# Patient Record
Sex: Female | Born: 1955
Health system: Southern US, Community
[De-identification: ages and names within clinical notes are randomized; demographics above are authoritative.]

## PROBLEM LIST (undated history)

## (undated) DIAGNOSIS — R822 Biliuria: Secondary | ICD-10-CM

## (undated) DIAGNOSIS — Z8489 Family history of other specified conditions: Secondary | ICD-10-CM

## (undated) DIAGNOSIS — Z809 Family history of malignant neoplasm, unspecified: Secondary | ICD-10-CM

## (undated) DIAGNOSIS — K8689 Other specified diseases of pancreas: Secondary | ICD-10-CM

## (undated) DIAGNOSIS — K219 Gastro-esophageal reflux disease without esophagitis: Secondary | ICD-10-CM

## (undated) DIAGNOSIS — M1990 Unspecified osteoarthritis, unspecified site: Secondary | ICD-10-CM

## (undated) DIAGNOSIS — C541 Malignant neoplasm of endometrium: Secondary | ICD-10-CM

## (undated) DIAGNOSIS — I10 Essential (primary) hypertension: Secondary | ICD-10-CM

## (undated) HISTORY — PX: HERNIA REPAIR: SHX51

## (undated) HISTORY — DX: Malignant neoplasm of endometrium: C54.1

## (undated) HISTORY — DX: Biliuria: R82.2

## (undated) HISTORY — DX: Family history of malignant neoplasm, unspecified: Z80.9

## (undated) HISTORY — PX: ABDOMINAL HYSTERECTOMY: SHX81

## (undated) HISTORY — DX: Other specified diseases of pancreas: K86.89

## (undated) HISTORY — PX: TUBAL LIGATION: SHX77

## (undated) HISTORY — DX: Gastro-esophageal reflux disease without esophagitis: K21.9

---

## 1998-07-02 ENCOUNTER — Encounter: Admission: RE | Admit: 1998-07-02 | Discharge: 1998-07-02 | Payer: Self-pay | Admitting: Family Medicine

## 1998-08-11 ENCOUNTER — Encounter: Admission: RE | Admit: 1998-08-11 | Discharge: 1998-08-11 | Payer: Self-pay | Admitting: Family Medicine

## 1999-01-25 ENCOUNTER — Encounter: Admission: RE | Admit: 1999-01-25 | Discharge: 1999-01-25 | Payer: Self-pay | Admitting: Sports Medicine

## 1999-02-02 ENCOUNTER — Encounter: Admission: RE | Admit: 1999-02-02 | Discharge: 1999-02-02 | Payer: Self-pay | Admitting: Family Medicine

## 1999-02-24 ENCOUNTER — Encounter: Admission: RE | Admit: 1999-02-24 | Discharge: 1999-02-24 | Payer: Self-pay | Admitting: Family Medicine

## 1999-03-09 ENCOUNTER — Encounter: Admission: RE | Admit: 1999-03-09 | Discharge: 1999-03-09 | Payer: Self-pay | Admitting: Family Medicine

## 1999-03-28 ENCOUNTER — Encounter: Admission: RE | Admit: 1999-03-28 | Discharge: 1999-03-28 | Payer: Self-pay | Admitting: Family Medicine

## 1999-06-10 ENCOUNTER — Encounter: Admission: RE | Admit: 1999-06-10 | Discharge: 1999-06-10 | Payer: Self-pay | Admitting: Family Medicine

## 1999-06-13 ENCOUNTER — Encounter: Admission: RE | Admit: 1999-06-13 | Discharge: 1999-06-13 | Payer: Self-pay | Admitting: Family Medicine

## 1999-06-30 ENCOUNTER — Emergency Department (HOSPITAL_COMMUNITY): Admission: EM | Admit: 1999-06-30 | Discharge: 1999-06-30 | Payer: Self-pay | Admitting: Emergency Medicine

## 1999-06-30 ENCOUNTER — Encounter: Payer: Self-pay | Admitting: Emergency Medicine

## 1999-11-14 ENCOUNTER — Emergency Department (HOSPITAL_COMMUNITY): Admission: EM | Admit: 1999-11-14 | Discharge: 1999-11-14 | Payer: Self-pay | Admitting: Emergency Medicine

## 1999-12-19 ENCOUNTER — Encounter: Admission: RE | Admit: 1999-12-19 | Discharge: 1999-12-19 | Payer: Self-pay | Admitting: Family Medicine

## 2000-02-08 ENCOUNTER — Encounter: Admission: RE | Admit: 2000-02-08 | Discharge: 2000-02-08 | Payer: Self-pay | Admitting: Family Medicine

## 2000-02-10 ENCOUNTER — Encounter: Admission: RE | Admit: 2000-02-10 | Discharge: 2000-02-10 | Payer: Self-pay | Admitting: Sports Medicine

## 2000-02-14 ENCOUNTER — Encounter: Admission: RE | Admit: 2000-02-14 | Discharge: 2000-02-14 | Payer: Self-pay | Admitting: Sports Medicine

## 2000-02-22 ENCOUNTER — Encounter: Admission: RE | Admit: 2000-02-22 | Discharge: 2000-02-22 | Payer: Self-pay | Admitting: Family Medicine

## 2000-07-06 ENCOUNTER — Encounter: Admission: RE | Admit: 2000-07-06 | Discharge: 2000-07-06 | Payer: Self-pay | Admitting: Family Medicine

## 2001-01-03 ENCOUNTER — Encounter: Admission: RE | Admit: 2001-01-03 | Discharge: 2001-01-03 | Payer: Self-pay | Admitting: Family Medicine

## 2001-01-04 ENCOUNTER — Encounter: Payer: Self-pay | Admitting: Sports Medicine

## 2001-01-04 ENCOUNTER — Encounter: Admission: RE | Admit: 2001-01-04 | Discharge: 2001-01-04 | Payer: Self-pay | Admitting: Sports Medicine

## 2001-01-17 ENCOUNTER — Encounter: Admission: RE | Admit: 2001-01-17 | Discharge: 2001-01-17 | Payer: Self-pay | Admitting: Family Medicine

## 2001-02-07 ENCOUNTER — Encounter: Admission: RE | Admit: 2001-02-07 | Discharge: 2001-02-07 | Payer: Self-pay | Admitting: Family Medicine

## 2001-04-18 ENCOUNTER — Emergency Department (HOSPITAL_COMMUNITY): Admission: EM | Admit: 2001-04-18 | Discharge: 2001-04-18 | Payer: Self-pay | Admitting: Emergency Medicine

## 2001-04-18 ENCOUNTER — Encounter: Payer: Self-pay | Admitting: Emergency Medicine

## 2001-05-22 ENCOUNTER — Emergency Department (HOSPITAL_COMMUNITY): Admission: EM | Admit: 2001-05-22 | Discharge: 2001-05-22 | Payer: Self-pay | Admitting: Emergency Medicine

## 2002-01-08 ENCOUNTER — Encounter: Payer: Self-pay | Admitting: *Deleted

## 2002-01-08 ENCOUNTER — Encounter: Admission: RE | Admit: 2002-01-08 | Discharge: 2002-01-08 | Payer: Self-pay | Admitting: *Deleted

## 2002-01-08 ENCOUNTER — Encounter: Admission: RE | Admit: 2002-01-08 | Discharge: 2002-01-08 | Payer: Self-pay | Admitting: Family Medicine

## 2002-01-21 ENCOUNTER — Encounter: Admission: RE | Admit: 2002-01-21 | Discharge: 2002-01-21 | Payer: Self-pay | Admitting: Sports Medicine

## 2002-02-05 ENCOUNTER — Encounter: Admission: RE | Admit: 2002-02-05 | Discharge: 2002-02-05 | Payer: Self-pay | Admitting: Sports Medicine

## 2002-02-05 ENCOUNTER — Encounter: Payer: Self-pay | Admitting: Sports Medicine

## 2002-02-12 ENCOUNTER — Encounter: Admission: RE | Admit: 2002-02-12 | Discharge: 2002-02-12 | Payer: Self-pay | Admitting: Family Medicine

## 2002-02-19 ENCOUNTER — Emergency Department (HOSPITAL_COMMUNITY): Admission: EM | Admit: 2002-02-19 | Discharge: 2002-02-19 | Payer: Self-pay | Admitting: *Deleted

## 2002-06-11 ENCOUNTER — Encounter: Admission: RE | Admit: 2002-06-11 | Discharge: 2002-06-11 | Payer: Self-pay | Admitting: Family Medicine

## 2002-08-11 ENCOUNTER — Encounter: Admission: RE | Admit: 2002-08-11 | Discharge: 2002-08-11 | Payer: Self-pay | Admitting: Sports Medicine

## 2002-12-30 ENCOUNTER — Encounter: Admission: RE | Admit: 2002-12-30 | Discharge: 2002-12-30 | Payer: Self-pay | Admitting: Sports Medicine

## 2004-05-06 ENCOUNTER — Emergency Department (HOSPITAL_COMMUNITY): Admission: EM | Admit: 2004-05-06 | Discharge: 2004-05-06 | Payer: Self-pay | Admitting: *Deleted

## 2004-05-13 ENCOUNTER — Encounter (INDEPENDENT_AMBULATORY_CARE_PROVIDER_SITE_OTHER): Payer: Self-pay | Admitting: *Deleted

## 2004-05-13 LAB — CONVERTED CEMR LAB

## 2004-06-06 ENCOUNTER — Encounter: Admission: RE | Admit: 2004-06-06 | Discharge: 2004-06-06 | Payer: Self-pay | Admitting: Family Medicine

## 2004-06-06 ENCOUNTER — Encounter (INDEPENDENT_AMBULATORY_CARE_PROVIDER_SITE_OTHER): Payer: Self-pay | Admitting: Specialist

## 2004-06-13 ENCOUNTER — Encounter: Admission: RE | Admit: 2004-06-13 | Discharge: 2004-06-13 | Payer: Self-pay | Admitting: Sports Medicine

## 2004-09-21 ENCOUNTER — Emergency Department (HOSPITAL_COMMUNITY): Admission: EM | Admit: 2004-09-21 | Discharge: 2004-09-21 | Payer: Self-pay | Admitting: Family Medicine

## 2004-12-01 ENCOUNTER — Ambulatory Visit: Payer: Self-pay | Admitting: Family Medicine

## 2005-01-09 ENCOUNTER — Ambulatory Visit: Payer: Self-pay | Admitting: Sports Medicine

## 2005-01-18 ENCOUNTER — Emergency Department (HOSPITAL_COMMUNITY): Admission: EM | Admit: 2005-01-18 | Discharge: 2005-01-18 | Payer: Self-pay | Admitting: Family Medicine

## 2005-08-19 ENCOUNTER — Emergency Department (HOSPITAL_COMMUNITY): Admission: EM | Admit: 2005-08-19 | Discharge: 2005-08-19 | Payer: Self-pay | Admitting: Emergency Medicine

## 2005-09-20 ENCOUNTER — Emergency Department (HOSPITAL_COMMUNITY): Admission: EM | Admit: 2005-09-20 | Discharge: 2005-09-20 | Payer: Self-pay | Admitting: Family Medicine

## 2006-01-12 ENCOUNTER — Emergency Department (HOSPITAL_COMMUNITY): Admission: EM | Admit: 2006-01-12 | Discharge: 2006-01-12 | Payer: Self-pay | Admitting: Family Medicine

## 2006-07-11 ENCOUNTER — Ambulatory Visit: Payer: Self-pay | Admitting: Family Medicine

## 2006-07-11 ENCOUNTER — Encounter: Admission: RE | Admit: 2006-07-11 | Discharge: 2006-07-11 | Payer: Self-pay | Admitting: Sports Medicine

## 2006-10-10 ENCOUNTER — Ambulatory Visit: Payer: Self-pay | Admitting: Family Medicine

## 2007-01-10 DIAGNOSIS — E669 Obesity, unspecified: Secondary | ICD-10-CM

## 2007-01-10 DIAGNOSIS — I10 Essential (primary) hypertension: Secondary | ICD-10-CM | POA: Insufficient documentation

## 2007-01-10 DIAGNOSIS — J309 Allergic rhinitis, unspecified: Secondary | ICD-10-CM | POA: Insufficient documentation

## 2007-01-10 DIAGNOSIS — D259 Leiomyoma of uterus, unspecified: Secondary | ICD-10-CM | POA: Insufficient documentation

## 2007-01-11 ENCOUNTER — Encounter (INDEPENDENT_AMBULATORY_CARE_PROVIDER_SITE_OTHER): Payer: Self-pay | Admitting: *Deleted

## 2007-02-18 ENCOUNTER — Telehealth: Payer: Self-pay | Admitting: *Deleted

## 2007-02-18 ENCOUNTER — Telehealth: Payer: Self-pay | Admitting: Family Medicine

## 2007-02-20 ENCOUNTER — Ambulatory Visit: Payer: Self-pay | Admitting: Family Medicine

## 2007-03-14 ENCOUNTER — Ambulatory Visit: Payer: Self-pay | Admitting: Sports Medicine

## 2007-04-24 ENCOUNTER — Encounter: Payer: Self-pay | Admitting: *Deleted

## 2007-10-22 ENCOUNTER — Emergency Department (HOSPITAL_COMMUNITY): Admission: EM | Admit: 2007-10-22 | Discharge: 2007-10-22 | Payer: Self-pay | Admitting: Emergency Medicine

## 2008-04-20 ENCOUNTER — Encounter: Payer: Self-pay | Admitting: Family Medicine

## 2008-09-11 ENCOUNTER — Encounter: Payer: Self-pay | Admitting: Family Medicine

## 2008-09-11 ENCOUNTER — Ambulatory Visit: Payer: Self-pay | Admitting: Family Medicine

## 2008-09-11 LAB — CONVERTED CEMR LAB
ALT: 16 units/L (ref 0–35)
CO2: 24 meq/L (ref 19–32)
Calcium: 9.3 mg/dL (ref 8.4–10.5)
Chloride: 105 meq/L (ref 96–112)
Creatinine, Ser: 0.94 mg/dL (ref 0.40–1.20)
Direct LDL: 109 mg/dL — ABNORMAL HIGH
Glucose, Bld: 84 mg/dL (ref 70–99)
TSH: 1.839 microintl units/mL (ref 0.350–4.50)

## 2008-09-17 ENCOUNTER — Encounter: Payer: Self-pay | Admitting: Family Medicine

## 2008-09-30 ENCOUNTER — Ambulatory Visit: Payer: Self-pay | Admitting: Family Medicine

## 2008-09-30 DIAGNOSIS — M545 Low back pain, unspecified: Secondary | ICD-10-CM | POA: Insufficient documentation

## 2009-01-01 ENCOUNTER — Telehealth (INDEPENDENT_AMBULATORY_CARE_PROVIDER_SITE_OTHER): Payer: Self-pay | Admitting: *Deleted

## 2009-01-07 ENCOUNTER — Ambulatory Visit: Payer: Self-pay | Admitting: Family Medicine

## 2009-06-02 ENCOUNTER — Ambulatory Visit: Payer: Self-pay | Admitting: Family Medicine

## 2009-10-01 ENCOUNTER — Emergency Department (HOSPITAL_COMMUNITY): Admission: EM | Admit: 2009-10-01 | Discharge: 2009-10-01 | Payer: Self-pay | Admitting: Pediatric Emergency Medicine

## 2010-03-22 ENCOUNTER — Encounter: Payer: Self-pay | Admitting: Family Medicine

## 2010-03-22 ENCOUNTER — Ambulatory Visit: Payer: Self-pay | Admitting: Family Medicine

## 2010-03-22 ENCOUNTER — Other Ambulatory Visit: Admission: RE | Admit: 2010-03-22 | Discharge: 2010-03-22 | Payer: Self-pay | Admitting: Family Medicine

## 2010-03-22 DIAGNOSIS — H547 Unspecified visual loss: Secondary | ICD-10-CM

## 2010-03-22 LAB — CONVERTED CEMR LAB
Alkaline Phosphatase: 84 units/L (ref 39–117)
BUN: 16 mg/dL (ref 6–23)
Glucose, Bld: 90 mg/dL (ref 70–99)
HDL: 59 mg/dL (ref >39)
LDL Cholesterol: 99 mg/dL (ref 0–99)
Sodium: 141 meq/L (ref 135–145)
Total Bilirubin: 0.4 mg/dL (ref 0.3–1.2)
Total Protein: 7.2 g/dL (ref 6.0–8.3)
Triglycerides: 52 mg/dL (ref <150)
VLDL: 10 mg/dL (ref 0–40)

## 2010-03-28 ENCOUNTER — Encounter: Payer: Self-pay | Admitting: Family Medicine

## 2010-05-26 ENCOUNTER — Telehealth: Payer: Self-pay | Admitting: Family Medicine

## 2010-05-27 ENCOUNTER — Ambulatory Visit: Payer: Self-pay | Admitting: Family Medicine

## 2010-05-27 DIAGNOSIS — K047 Periapical abscess without sinus: Secondary | ICD-10-CM | POA: Insufficient documentation

## 2010-06-24 ENCOUNTER — Telehealth: Payer: Self-pay | Admitting: *Deleted

## 2010-08-19 ENCOUNTER — Encounter: Payer: Self-pay | Admitting: Family Medicine

## 2010-09-01 ENCOUNTER — Telehealth: Payer: Self-pay | Admitting: *Deleted

## 2010-10-22 ENCOUNTER — Emergency Department (HOSPITAL_COMMUNITY)
Admission: EM | Admit: 2010-10-22 | Discharge: 2010-10-22 | Payer: Self-pay | Source: Home / Self Care | Admitting: Emergency Medicine

## 2010-12-13 NOTE — Assessment & Plan Note (Signed)
Summary: cpe,df   Vital Signs:  Patient profile:   55 year old female Height:      67 inches Weight:      221.4 pounds BMI:     34.80 Pulse rate:   68 / minute BP sitting:   165 / 100  (left arm)  Vitals Entered By: Arlyss Repress CMA, (Mar 22, 2010 9:08 AM) CC: physical. out of HTN meds x 2mos. tooth pain x 2 months.  Pain Assessment Patient in pain? yes     Location: tooth Intensity: 7 Onset of pain  x2 mos   Primary Care Provider:  Ardeen Garland  MD  CC:  physical. out of HTN meds x 2mos. tooth pain x 2 months. .  History of Present Illness: Here for CPE.  C/O back pain and tooth pain 1) CPE - had abnormal pap smear about 5-7 years ago, she says.  Last sexual intercourse was 2 years ago with her now ex-husband.  Last pap in 2009 was normal.  No history of breast cancer in 1st degree relatives.  Last labwork checked 10/09 - gluc, LDL, Cr all fine.  TSH normal. 2) HTN - off meds for 2 months.  Ran out.  THought her BP was better so she didn't think she needed it anymore.  Tolerated the medication fine.  No CP, headahce, SOB.  Does complain of blurry vision, trouble in the dark.  3) Tooth pain - 2 month.  Missing many teeth.  Left upper back tooth broken and very painful.  Right upper incisor with pain at gum line.  No fevers or discharge.   4) Back pain - has chronic back pain.  Takes flexeril only when really bad.  Allergic to ibuprofen.  Doesnt' like the way narcotics make her feel.  Not doing exercises or heating.    Habits & Providers  Alcohol-Tobacco-Diet     Tobacco Status: never  Current Medications (verified): 1)  Amlodipine Besylate 10 Mg Tabs (Amlodipine Besylate) .... 1/2 Tab By Mouth Daily 2)  Flexeril 5 Mg Tabs (Cyclobenzaprine Hcl) .Marland Kitchen.. 1 Tab By Mouth Q 8 Hrs As Needed Pain or Spasm 3)  Cleocin 300 Mg Caps (Clindamycin Hcl) .Marland Kitchen.. 1 Tab By Mouth Two Times A Day For 14 Days  Allergies: 1)  ! Pcn 2)  ! Ibuprofen 3)  Ace Inhibitors 4)   Hydrochlorothiazide  Past History:  Past Medical History: Last updated: 01/10/2007 [facility] diet referral 2/06- completed, h/o menopausal symptoms, h/o metrorrhagia, s/p BTL  Past Surgical History: Last updated: 01/10/2007 Pelvis U/S--fibroids, R hydrosalpinx - 01/04/2001  Family History: Last updated: 01/10/2007 2 brothers healthy.  4 adult children all healthy., Dad alive w/ h/o MI x 2 (starting age 2), DM., Denies h/o lung, colon, or breast CA.  Ovarian CA in cousins., Mom alive w/ HTN, bronchitis.  Social History: Last updated: 01/10/2007 Married since 2002.  Both work in a [employer].  Denies tob/ETOH/illicit drugs.  Denies herbal supplements or OTC meds.  Physical Exam  General:  obese, alert, NAD Eyes:  pupils equal, pupils round, corneas and lenses clear, and no injection.   Ears:  R ear normal and L ear normal.   Nose:  no external deformity, no external erythema, no nasal discharge, no mucosal pallor, no mucosal edema, and no airflow obstruction.   Mouth:  Oral mucosa and oropharynx without lesions or exudates.  Teeth in good repair. Lungs:  Normal respiratory effort, chest expands symmetrically. Lungs are clear to auscultation, no crackles or wheezes. Heart:  Normal rate and regular rhythm. S1 and S2 normal without gallop, murmur, click, rub or other extra sounds. Abdomen:  Bowel sounds positive,abdomen soft and non-tender without masses, organomegaly or hernias noted. Genitalia:  irritated appearing cervix with friability.  No discharge. normal introitus, no external lesions, no vaginal discharge, mucosa pink and moist, no vaginal atrophy, and normal uterus size and position.   Msk:  No deformity or scoliosis noted of thoracic or lumbar spine.   Pulses:  2+ dp and radial pulses Extremities:  no edema Skin:  Intact without suspicious lesions or rashes Cervical Nodes:  No lymphadenopathy noted Psych:  Cognition and judgment appear intact. Alert and cooperative with normal  attention span and concentration. No apparent delusions, illusions, hallucinations   Impression & Recommendations:  Problem # 1:  ROUTINE GYNECOLOGICAL EXAMINATION (ICD-V72.31)  Cervix abnormal appearing.  Await pap results.  If negative, she can space to 3 years.  Screening for DM, HLD completed.  Referred for mammogram.   Orders: [facility] - Est  40-64 yrs ([address])  Problem # 2:  UNSPECIFIED VISUAL LOSS (ICD-369.9) Haziness on ophthalmoscopic exam.  Possible cataracts.  Refer to ophtho. Screen for dm given young agae.  Orders: Comp Met-[facility] [id number]) Ophthalmology Referral (Ophthalmology)  Problem # 3:  HYPERTENSION, BENIGN SYSTEMIC (ICD-401.1) Assessment: Deteriorated Discussed her BP was better because she was taking the med and she will likely need it for life.  Restart amlodipine and recheck in 2 weeks.  Her updated medication list for this problem includes:    Amlodipine Besylate 10 Mg Tabs (Amlodipine besylate) .Marland Kitchen... 1/2 tab by mouth daily  Orders: Comp Met-[facility] [id number]) Lipid-[facility] ([id number]) TSH-[facility] ([id number])  Problem # 4:  BACK PAIN, LUMBAR (ICD-724.2) Assessment: Unchanged Avised to resume back exercise.  Flexeril as needed.  Her updated medication list for this problem includes:    Flexeril 5 Mg Tabs (Cyclobenzaprine hcl) .Marland Kitchen... 1 tab by mouth q 8 hrs as needed pain or spasm  Complete Medication List: 1)  Amlodipine Besylate 10 Mg Tabs (Amlodipine besylate) .... 1/2 tab by mouth daily 2)  Flexeril 5 Mg Tabs (Cyclobenzaprine hcl) .Marland Kitchen.. 1 tab by mouth q 8 hrs as needed pain or spasm 3)  Cleocin 300 Mg Caps (Clindamycin hcl) .Marland Kitchen.. 1 tab by mouth two times a day for 14 days  Other Orders: Pap Smear-[facility] ([id number]) Mammogram (Screening) (Mammo) Dental Referral (Dentist)  Patient Instructions: 1)  Please start taking your blood pressure medicine, Amlodipine, everyday again. 2)  Please return in 2 weeks for a recheck of your blood pressure and to discuss your  lab results.  3)  Please take the order for the mammogram to [facility] at your earliest convenience to have your mammogram done.  Call the number on the form to make an appointment.  Prescriptions: CLEOCIN 300 MG CAPS (CLINDAMYCIN HCL) 1 tab by mouth two times a day for 14 days  #28 x 3   Entered and Authorized by:   Ardeen Garland  MD   Signed by:   Ardeen Garland  MD on 03/22/2010   Method used:   Print then Give to Patient   RxID:   [id number] FLEXERIL 5 MG TABS (CYCLOBENZAPRINE HCL) 1 tab by mouth q 8 hrs as needed pain or spasm  #33 x 3   Entered and Authorized by:   Ardeen Garland  MD   Signed by:   Ardeen Garland  MD on 03/22/2010   Method used:   Print then Give to Patient   RxID:  [id number] AMLODIPINE BESYLATE 10 MG TABS (AMLODIPINE BESYLATE) 1/2 tab by mouth daily  #33 x 5   Entered and Authorized by:   Ardeen Garland  MD   Signed by:   Ardeen Garland  MD on 03/22/2010   Method used:   Print then Give to Patient   RxID:   [id number]

## 2010-12-13 NOTE — Letter (Signed)
Summary: Generic Letter  [facility]     [place], [place]    Phone:   Fax:     08/19/2010  Hannah Decker [address] [place], [place]  [address]  Dear Hannah Decker,   I am writing to remind you to please make an appointment for a screening mammogram at your earliest convenience. [facility] number is [phone] and the [facility] number is [phone].  I look forward to meeting you at your next well-woman visit!      Sincerely,   Demetria Pore MD  Appended Document: Generic Letter mailed.  Appended Document: Generic Letter letter returned unable to forward.

## 2010-12-13 NOTE — Progress Notes (Signed)
Summary: phn msg   Phone Note Call from Patient Call back at [phone]   Caller: Patient Summary of Call: pt is wanting more meds for toothache - she has not been able to get to dentist Initial call taken by: De Nurse,  May 26, 2010 12:04 PM  Follow-up for Phone Call        states she has no insurance & is waiting for [facility] to call her. says the pain is bad. states ibuprofen makes her break out. advised tylenol & will be seen tomorrow at 10:30 by Dr. Ashley Royalty. she agreed with plan Follow-up by: Golden Circle RN,  May 26, 2010 2:03 PM

## 2010-12-13 NOTE — Letter (Signed)
Summary: Generic Letter  [facility]  [address]   [place], [place] [address]   Phone: [phone]  Fax: [phone]    03/28/2010  Hannah Decker [address] [place], [place]  [address]  Dear Ms. Hannah Decker,  I am happy to inform you that your pap smear was normal.  You can now space out to a pap smear every 3 years.  Also, all of your labwork was completely normal.  Your blood sugar was excellent as was your cholesterol.  If you have any questions, please call our office.          Sincerely,   Ardeen Garland  MD  Appended Document: Generic Letter mailed.

## 2010-12-13 NOTE — Progress Notes (Signed)
Summary: waiting for call back/ts   ---- Converted from flag ---- ---- 09/01/2010 2:01 PM, Demetria Pore MD wrote: Please check with pt if pt kept Opthomology and Dental appts. Thanks! ------------------------------ called pt. left message with pt's mom to call us back. waiting for call back. please ask pt above questions. thank you.Arlyss Repress CMA,  September 01, 2010 5:54 PM

## 2010-12-13 NOTE — Assessment & Plan Note (Signed)
Summary: dental pain/[place]/mcgill   Vital Signs:  Patient profile:   55 year old female Weight:      214 pounds Temp:     98.3 degrees F oral Pulse rate:   80 / minute BP sitting:   140 / 82  (left arm) Cuff size:   regular  Vitals Entered By: Arlyss Repress CMA, (May 27, 2010 10:27 AM) CC: tooth pain and radiates into right neck x 3 days. Is Patient Diabetic? No Pain Assessment Patient in pain? yes     Location: r side of neck/tooth Onset of pain  x3d   Primary Provider:  Ardeen Garland  MD  CC:  tooth pain and radiates into right neck x 3 days.Marland Kitchen  History of Present Illness: Dental Pain- Pt. with dental pain x 3 days.  Pain is in upper right molar area.  Describes pain as aching and throbbing.  Pain does radiate into neck and ear sometimes. Has not taken anything for the pain, states ibuprofen gives her rash and tylenol makes her dizzy.  Has called [facility] but currently on waiting list because there is no dentist available.  Has had tooth pain in the past, was seen in May.  Pain was on opposite time at that time, given clindamycin with resolution of symptoms.  Denies fever, chills, chest pain, shortness of breath, trouble swallowing.  Current Medications (verified): 1)  Amlodipine Besylate 10 Mg Tabs (Amlodipine Besylate) .... 1/2 Tab By Mouth Daily 2)  Flexeril 5 Mg Tabs (Cyclobenzaprine Hcl) .Marland Kitchen.. 1 Tab By Mouth Q 8 Hrs As Needed Pain or Spasm 3)  Cleocin 300 Mg Caps (Clindamycin Hcl) .Marland Kitchen.. 1 Tab By Mouth Two Times A Day For 14 Days 4)  Tramadol Hcl 50 Mg Tabs (Tramadol Hcl) .Marland Kitchen.. 1 Tab By Mouth Every 4-6 Hours As Needed For Pain 5)  Diclofenac Sodium 75 Mg Tbec (Diclofenac Sodium) .Marland Kitchen.. 1 Tab By Mouth Two Times A Day As Needed For Pain  Allergies (verified): 1)  ! Pcn 2)  ! Ibuprofen 3)  Ace Inhibitors 4)  Hydrochlorothiazide  Physical Exam  General:  Well-developed,well-nourished,in no acute distress; alert,appropriate and cooperative throughout  examination Head:  Normocephalic and atraumatic without obvious abnormalities.  Mouth:  poor dentition and teeth missing.  Multiple caries in teeth.  No purulent fluid around tooth that she complains with.  Mild swelling along upper gum line on right   Impression & Recommendations:  Problem # 1:  ABSCESS, TOOTH (ICD-522.5) Started her on clindamycin again since allergic to PCN.  Also gave her tramadol and diclofenac as needed for pain.  Gave her instructions and warned her about if she experiences diarrhea on clindamycin and other red flags regarding tooth ache (See pt. Instructions).  Also sent in another referral to dental clinic so that she may can get in quicker Orders: Dental Referral (Dentist) [facility]- Est Level  3 ([address])  Complete Medication List: 1)  Amlodipine Besylate 10 Mg Tabs (Amlodipine besylate) .... 1/2 tab by mouth daily 2)  Flexeril 5 Mg Tabs (Cyclobenzaprine hcl) .Marland Kitchen.. 1 tab by mouth q 8 hrs as needed pain or spasm 3)  Cleocin 300 Mg Caps (Clindamycin hcl) .Marland Kitchen.. 1 tab by mouth two times a day for 14 days 4)  Tramadol Hcl 50 Mg Tabs (Tramadol hcl) .Marland Kitchen.. 1 tab by mouth every 4-6 hours as needed for pain 5)  Diclofenac Sodium 75 Mg Tbec (Diclofenac sodium) .Marland Kitchen.. 1 tab by mouth two times a day as needed for pain  Patient  Instructions: 1)  I am putting you back on the antibiotic for the tooth pain.  If you experience diarrhea while on this medication, stop immediately and come back to been evaluated 2)  I am giving you some medication for pain 3)  I have sent in another referral to the dental clinic to see if they can get you in sooner 4)  If you experience fever, chills, increased pain or feel like the tooth is not getting better come back to be seen again Prescriptions: CLEOCIN 300 MG CAPS (CLINDAMYCIN HCL) 1 tab by mouth two times a day for 14 days  #28 x 3   Entered and Authorized by:   Everrett Coombe DO   Signed by:   Everrett Coombe DO on 05/27/2010   Method used:   Printed  then faxed to ...       [facility] Department (retail)       [address] [place], [place]  [address]       Ph: [phone]       Fax: [phone]   RxID:   [id number] DICLOFENAC SODIUM 75 MG TBEC (DICLOFENAC SODIUM) 1 tab by mouth two times a day as needed for pain  #60 x 1   Entered and Authorized by:   Everrett Coombe DO   Signed by:   Everrett Coombe DO on 05/27/2010   Method used:   Printed then faxed to ...       [facility] Department (retail)       [address] [place], [place]  [address]       Ph: [phone]       Fax: [phone]   RxID:   [id number] TRAMADOL HCL 50 MG TABS (TRAMADOL HCL) 1 tab by mouth every 4-6 hours as needed for pain  #60 x 1   Entered and Authorized by:   Everrett Coombe DO   Signed by:   Everrett Coombe DO on 05/27/2010   Method used:   Printed then faxed to ...       [facility] Department (retail)       [address] [place], [place]  [address]       Ph: [phone]       Fax: [phone]   RxID:   [id number]

## 2010-12-13 NOTE — Progress Notes (Signed)
----   Converted from flag ---- ---- 06/24/2010 12:15 PM, Pearlean Brownie MD wrote: Pls check with patient and see if she went to her Eye referral. thanks LC ------------------------------  called and left message with relative for pt to return call monday morning

## 2010-12-29 ENCOUNTER — Encounter: Payer: Self-pay | Admitting: *Deleted

## 2011-01-15 ENCOUNTER — Encounter: Payer: Self-pay | Admitting: *Deleted

## 2011-08-16 ENCOUNTER — Encounter: Payer: Self-pay | Admitting: Family Medicine

## 2011-08-16 ENCOUNTER — Ambulatory Visit (INDEPENDENT_AMBULATORY_CARE_PROVIDER_SITE_OTHER): Payer: Self-pay | Admitting: Family Medicine

## 2011-08-16 DIAGNOSIS — Z0000 Encounter for general adult medical examination without abnormal findings: Secondary | ICD-10-CM

## 2011-08-16 DIAGNOSIS — Z01419 Encounter for gynecological examination (general) (routine) without abnormal findings: Secondary | ICD-10-CM | POA: Insufficient documentation

## 2011-08-16 DIAGNOSIS — I10 Essential (primary) hypertension: Secondary | ICD-10-CM

## 2011-08-16 DIAGNOSIS — M545 Low back pain: Secondary | ICD-10-CM

## 2011-08-16 MED ORDER — AMLODIPINE BESYLATE 10 MG PO TABS: 5.0000 mg | ORAL_TABLET | Freq: Every day | ORAL | Status: DC

## 2011-08-16 NOTE — Patient Instructions (Signed)
It was great to meet you. I am refilling your blood pressure medicine.  If you start getting dizzy or lightheaded w/ standing, stop the medicine and come back to see me.  Otherwise, come back and see me in 4-6 weeks so we can see how your blood pressure is.  When you come back, please try to come fasting (nothing to eat or drink other than water) so we can draw some labs. OR, the alternative would be to come back for a lab only appointment the week before you see me to have your labs drawn.   Keep up the walking and stretching for your back pain and I think we'll be able to avoid any medicines for it.

## 2011-08-16 NOTE — Assessment & Plan Note (Signed)
Stable, no red flags. Pt does not wish to start/restart pain medicines.

## 2011-08-16 NOTE — Assessment & Plan Note (Signed)
BP elevated today to 169/93 but has been off meds for months.  Will restart norvasc 5 and have pt f/u in 4-6 weeks for BP recheck and for lab draw, as pt needs baseline BMET since last labs were >1 year ago. Gave pt warning signs of hypotension.

## 2011-08-16 NOTE — Assessment & Plan Note (Signed)
Pt w/ nml PAP 03/2010, not due for another one until 03/2012. Will return for next appt fasting for CBC, BMET, FLP.

## 2011-08-16 NOTE — Progress Notes (Signed)
S: Pt comes in today for refills.  HYPERTENSION BP: 169/93 Meds: was on norvasc  Taking meds: No    # of doses missed/week: ALL for "months and months" Symptoms: Headache: No Dizziness: No  Vision changes: No SOB:  No Chest pain: No LE swelling: No Tobacco use: No   BACK PAIN Better with walking in general and "fetal position"/stretching for cramping. No bowel/bladder incontinence. No numbness/tingling/weakness in legs. No falls. No swelling in legs.  Doesn't want to restart meds she was on previously b/c they made her sleepy/dizzy.     ROS: Per HPI  History  Smoking status  . Never Smoker   Smokeless tobacco  . Never Used    O:  Filed Vitals:   08/16/11 1435  BP: 169/93  Pulse: 69  Temp: 98.1 F (36.7 C)    Gen: NAD CV: RRR, no murmur Pulm: CTA bilat, no wheezes or crackles Abd: soft, NT Ext: Warm, no chronic skin changes, no edema Back: no spinal or paraspinal tenderness. Strength 5/5 BLE, sensation intact, gait normal   A/P: 55 y.o. female p/w HTN -See problem list -f/u in 4-6 weeks

## 2011-09-04 ENCOUNTER — Telehealth: Payer: Self-pay | Admitting: Family Medicine

## 2011-09-04 NOTE — Telephone Encounter (Signed)
Called pt.

## 2011-09-04 NOTE — Telephone Encounter (Signed)
Hannah Decker is taking medication that she feels is making her dizzy and she wants a different medication sent to [address] on [address].

## 2011-09-04 NOTE — Telephone Encounter (Signed)
Pt reports, that she has been on Amlodipine for 3 weeks and feels dizzy x 1 week. She did not take it today, but still feels dizzy. Advised pt to be evaluated for her dizziness. It may not be the medicine. Pt agreed. Fwd. To Dr.McGill for review .Hannah Decker

## 2011-09-19 ENCOUNTER — Ambulatory Visit: Payer: Self-pay | Admitting: Family Medicine

## 2011-10-10 ENCOUNTER — Ambulatory Visit (INDEPENDENT_AMBULATORY_CARE_PROVIDER_SITE_OTHER): Payer: Self-pay | Admitting: Family Medicine

## 2011-10-10 ENCOUNTER — Encounter: Payer: Self-pay | Admitting: Family Medicine

## 2011-10-10 VITALS — BP 167/95 | HR 68 | Temp 98.6°F | Ht 67.0 in | Wt 214.0 lb

## 2011-10-10 DIAGNOSIS — J029 Acute pharyngitis, unspecified: Secondary | ICD-10-CM

## 2011-10-10 DIAGNOSIS — J329 Chronic sinusitis, unspecified: Secondary | ICD-10-CM

## 2011-10-10 DIAGNOSIS — J309 Allergic rhinitis, unspecified: Secondary | ICD-10-CM

## 2011-10-10 LAB — POCT RAPID STREP A (OFFICE): Rapid Strep A Screen: NEGATIVE

## 2011-10-10 MED ORDER — LORATADINE 10 MG PO TABS: 10.0000 mg | ORAL_TABLET | Freq: Every day | ORAL | Status: DC

## 2011-10-10 MED ORDER — FLUTICASONE PROPIONATE 50 MCG/ACT NA SUSP: 2.0000 | Freq: Every day | NASAL | Status: DC

## 2011-10-10 NOTE — Patient Instructions (Signed)

## 2011-10-12 DIAGNOSIS — J309 Allergic rhinitis, unspecified: Secondary | ICD-10-CM | POA: Insufficient documentation

## 2011-10-12 NOTE — Progress Notes (Signed)
  Subjective:    Patient ID: Hannah Decker, female    DOB: July 12, 1956, 55 y.o.   MRN: [id number]  HPI Patient is here for evaluation of viral URI symptoms x2-3 weeks. Patient states she's had rhinorrhea, nasal congestion, cough, pharyngitis over the same time period. No fevers. No sick contacts. No odynophagia. Patient states she's had intermittent issues with allergies for several years which are mainly predominant in the spring and fall. Patient does report a previous history of allergic rhinitis. Patient intermittently uses antihistamines for treatment. Review of Systems See HPI, otherwise 12 point ROS negative     Objective:   Physical Exam Gen: in bed, NAD HEENT: + nasal turbinate swelling and erythema, + rhinorrhea, + post oropharyngeal erythema, no tonsillar exudate, no cervical LAD CV: RRR, no murmurs PULM: CTAB, no wheezes   Assessment & Plan:

## 2011-10-12 NOTE — Assessment & Plan Note (Addendum)
Exam and history are consistent with allergic rhinitis/sinusitis. Will start patient on Flonase as well as Zyrtec. No clinical signs of infection currently. Infectious red flags discussed at length. Sinusitis handout given.

## 2011-10-25 ENCOUNTER — Ambulatory Visit: Payer: Self-pay | Admitting: Family Medicine

## 2011-11-09 ENCOUNTER — Ambulatory Visit (INDEPENDENT_AMBULATORY_CARE_PROVIDER_SITE_OTHER): Payer: Self-pay | Admitting: Family Medicine

## 2011-11-09 ENCOUNTER — Encounter: Payer: Self-pay | Admitting: Family Medicine

## 2011-11-09 DIAGNOSIS — E669 Obesity, unspecified: Secondary | ICD-10-CM

## 2011-11-09 DIAGNOSIS — I10 Essential (primary) hypertension: Secondary | ICD-10-CM

## 2011-11-09 DIAGNOSIS — J309 Allergic rhinitis, unspecified: Secondary | ICD-10-CM

## 2011-11-09 LAB — CBC
Hemoglobin: 13.7 g/dL (ref 12.0–15.0)
MCH: 27.1 pg (ref 26.0–34.0)
RBC: 5.06 MIL/uL (ref 3.87–5.11)
WBC: 5.2 10*3/uL (ref 4.0–10.5)

## 2011-11-09 LAB — BASIC METABOLIC PANEL
CO2: 28 mEq/L (ref 19–32)
Calcium: 9.5 mg/dL (ref 8.4–10.5)
Chloride: 107 mEq/L (ref 96–112)
Glucose, Bld: 85 mg/dL (ref 70–99)
Sodium: 142 mEq/L (ref 135–145)

## 2011-11-09 LAB — LIPID PANEL
LDL Cholesterol: 101 mg/dL — ABNORMAL HIGH (ref 0–99)
Total CHOL/HDL Ratio: 2.6 Ratio
VLDL: 8 mg/dL (ref 0–40)

## 2011-11-09 MED ORDER — AMLODIPINE BESYLATE 10 MG PO TABS: 10.0000 mg | ORAL_TABLET | Freq: Every day | ORAL | Status: DC

## 2011-11-09 NOTE — Patient Instructions (Signed)
It was good to see you today! We're drawing some labs today.  You can expect a letter from me with the results.  Increase your blood pressure medicine to 1 full pill a day (10 mg).  Make sure you are taking this every day!   Watch your salt intake!  High salt will make your blood pressure stay high!  Read labels on things to see how much sodium is in them. It's very important for you to get your COLONOSCOPY and MAMMOGRAM.  We have given you information today on getting your mammogram scheduled.  Please do this at your earliest convenience! Come back in 1 month so we can recheck your blood pressure.    2 Gram Low Sodium Diet A 2 gram sodium diet restricts the amount of sodium in the diet to no more than 2 g or 2000 mg daily. Limiting the amount of sodium is often used to help lower blood pressure. It is important if you have heart, liver, or kidney problems. Many foods contain sodium for flavor and sometimes as a preservative. When the amount of sodium in a diet needs to be low, it is important to know what to look for when choosing foods and drinks. The following includes some information and guidelines to help make it easier for you to adapt to a low sodium diet. QUICK TIPS  Do not add salt to food.   Avoid convenience items and fast food.   Choose unsalted snack foods.   Buy lower sodium products, often labeled as "lower sodium" or "no salt added."   Check food labels to learn how much sodium is in 1 serving.   When eating at a restaurant, ask that your food be prepared with less salt or none, if possible.  READING FOOD LABELS FOR SODIUM INFORMATION The nutrition facts label is a good place to find how much sodium is in foods. Look for products with no more than 500 to 600 mg of sodium per meal and no more than 150 mg per serving. Remember that 2 g = 2000 mg. The food label may also list foods as:  Sodium-free: Less than 5 mg in a serving.   Very low sodium: 35 mg or less in a  serving.   Low-sodium: 140 mg or less in a serving.   Light in sodium: 50% less sodium in a serving. For example, if a food that usually has 300 mg of sodium is changed to become light in sodium, it will have 150 mg of sodium.   Reduced sodium: 25% less sodium in a serving. For example, if a food that usually has 400 mg of sodium is changed to reduced sodium, it will have 300 mg of sodium.  CHOOSING FOODS Grains  Avoid: Salted crackers and snack items. Some cereals, including instant hot cereals. Bread stuffing and biscuit mixes. Seasoned rice or pasta mixes.   Choose: Unsalted snack items. Low-sodium cereals, oats, puffed wheat and rice, shredded wheat. English muffins and bread. Pasta.  Meats  Avoid: Salted, canned, smoked, spiced, pickled meats, including fish and poultry. Bacon, ham, sausage, cold cuts, hot dogs, anchovies.   Choose: Low-sodium canned tuna and salmon. Fresh or frozen meat, poultry, and fish.  Dairy  Avoid: Processed cheese and spreads. Cottage cheese. Buttermilk and condensed milk. Regular cheese.   Choose: Milk. Low-sodium cottage cheese. Yogurt. Sour cream. Low-sodium cheese.  Fruits and Vegetables  Avoid: Regular canned vegetables. Regular canned tomato sauce and paste. Frozen vegetables in sauces. Olives. Rosita Fire.  Relishes. Sauerkraut.   Choose: Low-sodium canned vegetables. Low-sodium tomato sauce and paste. Frozen or fresh vegetables. Fresh and frozen fruit.  Condiments  Avoid: Canned and packaged gravies. Worcestershire sauce. Tartar sauce. Barbecue sauce. Soy sauce. Steak sauce. Ketchup. Onion, garlic, and table salt. Meat flavorings and tenderizers.   Choose: Fresh and dried herbs and spices. Low-sodium varieties of mustard and ketchup. Lemon juice. Tabasco sauce. Horseradish.  SAMPLE 2 GRAM SODIUM MEAL PLAN Breakfast / Sodium (mg)  1 cup low-fat milk / 143 mg   2 slices whole-wheat toast / 270 mg   1 tbs heart-healthy margarine / 153 mg   1  hard-boiled egg / 139 mg   1 small orange / 0 mg  Lunch / Sodium (mg)  1 cup raw carrots / 76 mg    cup hummus / 298 mg   1 cup low-fat milk / 143 mg    cup red grapes / 2 mg   1 whole-wheat pita bread / 356 mg  Dinner / Sodium (mg)  1 cup whole-wheat pasta / 2 mg   1 cup low-sodium tomato sauce / 73 mg   3 oz lean ground beef / 57 mg   1 small side salad (1 cup raw spinach leaves,  cup cucumber,  cup yellow bell pepper) with 1 tsp olive oil and 1 tsp red wine vinegar / 25 mg  Snack / Sodium (mg)  1 container low-fat vanilla yogurt / 107 mg   3 graham cracker squares / 127 mg  Nutrient Analysis  Calories: 2033   Protein: 77 g   Carbohydrate: 282 g   Fat: 72 g   Sodium: 1971 mg  Document Released: 10/30/2005 Document Revised: 07/12/2011 Document Reviewed: 01/31/2010 [facility] Patient Information 2012 [place], [place].

## 2011-11-09 NOTE — Progress Notes (Signed)
Addended by: Demetria Pore A on: 11/09/2011 10:20 AM   Modules accepted: Orders

## 2011-11-09 NOTE — Assessment & Plan Note (Signed)
Patient aware that weight gain likely contributing to elevated blood pressure. Patient states that she has started walking more. Encouraged patient to continue this. Will set more definite goals at next visit.  Wt Readings from Last 3 Encounters:  11/09/11 218 lb 4.8 oz (99.02 kg)  10/10/11 214 lb (97.07 kg)  08/16/11 210 lb (95.255 kg)

## 2011-11-09 NOTE — Assessment & Plan Note (Signed)
No fevers or chills. Patient only taking generic Claritin. She stopped Flonase 2 to nasal discomfort. Encouraged patient to use nasal saline. Counseled patient that she could get brand-name Claritin over-the-counter if this is what she wanted. No red flags.

## 2011-11-09 NOTE — Assessment & Plan Note (Addendum)
Patient reports medication noncompliance. However, despite this, Norvasc will still need to be increased. We'll start Norvasc 10 mg daily, increased from 5 mg daily. Counseled patient on the importance of medication compliance. No red flags. Counseled patient on a low sodium diet. Followup one month for blood pressure recheck.

## 2011-11-09 NOTE — Progress Notes (Signed)
S: Pt comes in today for follow up.  HYPERTENSION BP: 159/88 Meds: norvasc 5mg  Taking meds: Yes : no issues, but has been skipping for 5-6 days (taking every other day)    # of doses missed/week: has been 4 Symptoms: Headache: No Dizziness: No Vision changes: No SOB:  No Chest pain: No LE swelling: No Tobacco use: No Diet: tries to watch salt   CONGESTION Still having congestion.  Would like name brand claritin b/c generic isn't working.  Stopped flonase because it was giving her a headache.  No fevers or chills, minimal mucus production. No sinus tenderness. Minimal cough. No nausea, vomiting, or diarrhea.   ROS: Per HPI  History  Smoking status  . Never Smoker   Smokeless tobacco  . Never Used    O: There were no vitals filed for this visit.  Gen: NAD CV: RRR, no murmur Pulm: CTA bilat, no wheezes or crackles Ext: Warm, no chronic skin changes, no edema   A/P: 55 y.o. female p/w HTN, continued URI/allergy symptoms -See problem list -f/u in 1 month

## 2011-11-13 ENCOUNTER — Encounter: Payer: Self-pay | Admitting: Family Medicine

## 2011-11-21 ENCOUNTER — Telehealth: Payer: Self-pay | Admitting: Family Medicine

## 2011-11-21 NOTE — Telephone Encounter (Signed)
Spoke with patient. She started on increased amlodipine dose (10 mg) after last visit on 12/27. For past four days she complains with stomach making lots of noice and having a stomachache. Also at night she feels her food doesn't digest well and stomach makes noises and she has to sit up in bed because of this. Reports  normal BM daily. She feels stomach problem   is due to amlodipine. Had no problem with  taking 5 mg amlodipine .

## 2011-11-21 NOTE — Telephone Encounter (Signed)
Consulted with Dr. Jennette Kettle and she advises for patient to drop back down to 5 mg amlodipine daily for 2 weeks.  The if no porblem increase back to 10 mg and try again. Appointment scheduled with Dr. Fara Boros  for 01/30 for follow up.

## 2011-11-21 NOTE — Telephone Encounter (Signed)
Is taking amlodipine and it makes her stomach feel funny and wants to talk to nurse

## 2011-12-13 ENCOUNTER — Ambulatory Visit: Payer: Self-pay | Admitting: Family Medicine

## 2011-12-29 ENCOUNTER — Other Ambulatory Visit: Payer: Self-pay | Admitting: *Deleted

## 2011-12-29 ENCOUNTER — Telehealth: Payer: Self-pay | Admitting: Family Medicine

## 2011-12-29 DIAGNOSIS — I10 Essential (primary) hypertension: Secondary | ICD-10-CM

## 2011-12-29 MED ORDER — AMLODIPINE BESYLATE 10 MG PO TABS: 10.0000 mg | ORAL_TABLET | Freq: Every day | ORAL | Status: DC

## 2011-12-29 NOTE — Telephone Encounter (Signed)
Patient is calling for a refill on her Amlodipine, at least enough to last until her appt on 3/4.  [facility] uses [facility] [address].  Please give the patient a call with the decision.

## 2012-01-15 ENCOUNTER — Encounter: Payer: Self-pay | Admitting: Family Medicine

## 2012-01-15 ENCOUNTER — Ambulatory Visit (INDEPENDENT_AMBULATORY_CARE_PROVIDER_SITE_OTHER): Payer: Self-pay | Admitting: Family Medicine

## 2012-01-15 VITALS — BP 144/86 | HR 98 | Ht 67.0 in | Wt 216.9 lb

## 2012-01-15 DIAGNOSIS — I10 Essential (primary) hypertension: Secondary | ICD-10-CM

## 2012-01-15 DIAGNOSIS — D239 Other benign neoplasm of skin, unspecified: Secondary | ICD-10-CM

## 2012-01-15 DIAGNOSIS — D229 Melanocytic nevi, unspecified: Secondary | ICD-10-CM | POA: Insufficient documentation

## 2012-01-15 NOTE — Assessment & Plan Note (Signed)
Will start OTC PPI and see if pt will tolerate 10mg  norvasc daily rather than every other to every few days.  BP slightly improved, but not at goal.  Will also have pt tart taking ASA 81mg . If pt unable to tolerate 10mg  qd, will refer to Pharmacy clinic as pt with multiple intolerances to multiple BP meds.

## 2012-01-15 NOTE — Progress Notes (Signed)
S: Pt comes in today for follow up.  HYPERTENSION BP:  144/86 Meds: norvasc 10, taking 10mg  some nights, 5mg  others Taking meds: Yes : only takes all 10mg  sometimes    # of doses missed/week: 0 Symptoms: Headache: No Dizziness: No Vision changes: No SOB:  No Chest pain: No LE swelling: No Tobacco use: No *Having heartburn after takes 10mg , occasionally abdominal upset and gas, but doesn't happen after taking 5mg  only    MOLE ON LEFT HAND Started hurting 2 months ago.  Has been on her hand for a year, but has grown.  No person or family h/o skin cancer.  Patient thinks that it started off as a small dot.  At one time, she popped it with a pin to see if she could make it go away, which it didn't.  Now, it has gotten bigger and is sore.    ROS: Per HPI  History  Smoking status  . Never Smoker   Smokeless tobacco  . Never Used    O:  Filed Vitals:   01/15/12 1502  BP: 144/86  Pulse: 98    Gen: NAD CV: RRR, no murmur Pulm: CTA bilat, no wheezes or crackles Ext: Warm, no chronic skin changes, no edema; left hand at base of 5th finger with central pallor, 0.5cm in diameter, round/smooth borders, no erythema or irregularities     A/P: 56 y.o. female p/w mole, HTN -See problem list -f/u in 1 month

## 2012-01-15 NOTE — Patient Instructions (Addendum)
It was great to see you! Please starting taking an acid reflux medicine (anything like Prilosec, protonix is fine).  You can just use an over the counter one.  You can ask your pharmacist for advice (generics are completely fine to use since they will cost much less).  If possible, start taking all 10mg  of norvasc every day. If you blood pressure is still high with this, I will have you see Dr. Raymondo Band with [facility] to help [place] find a good blood pressure regimen.  Start taking a baby aspirin, 81mg , every day. We will plan on taking the mole of fof your left hand at your next visit in 1 month.

## 2012-01-15 NOTE — Assessment & Plan Note (Signed)
0.5cm, round, smooth borders, symmetric with central clearing.  D/w Dr. Leveda Anna, more likely to be fibroma with post-inflammatory hyperpigmentation, but pt agreeable to excision at next appt in next month.  Will do elliptical excision in order to get area in its entirety (punch bx will not be enough).

## 2012-02-06 ENCOUNTER — Other Ambulatory Visit: Payer: Self-pay | Admitting: Family Medicine

## 2012-02-14 ENCOUNTER — Encounter: Payer: Self-pay | Admitting: Family Medicine

## 2012-02-14 ENCOUNTER — Ambulatory Visit (INDEPENDENT_AMBULATORY_CARE_PROVIDER_SITE_OTHER): Payer: Self-pay | Admitting: Family Medicine

## 2012-02-14 VITALS — BP 130/80 | HR 64 | Temp 98.4°F | Ht 67.0 in | Wt 224.0 lb

## 2012-02-14 DIAGNOSIS — D229 Melanocytic nevi, unspecified: Secondary | ICD-10-CM

## 2012-02-14 DIAGNOSIS — I10 Essential (primary) hypertension: Secondary | ICD-10-CM

## 2012-02-14 DIAGNOSIS — D2360 Other benign neoplasm of skin of unspecified upper limb, including shoulder: Secondary | ICD-10-CM | POA: Insufficient documentation

## 2012-02-14 DIAGNOSIS — D239 Other benign neoplasm of skin, unspecified: Secondary | ICD-10-CM

## 2012-02-14 NOTE — Assessment & Plan Note (Signed)
Tolerating better with Tums.  BP improved, despite continued noncompliance, although compliance is slightly improved.    BP Readings from Last 3 Encounters:  02/14/12 130/80  01/15/12 144/86  11/09/11 159/88

## 2012-02-14 NOTE — Progress Notes (Signed)
S: Pt comes in today for follow up.  HYPERTENSION BP: 130/80 Meds: norvasc 10mg  Taking meds: Yes : sometimes    # of doses missed/week: 4 Symptoms: Headache: No Dizziness: No Vision changes: No SOB:  No Chest pain: No LE swelling: No Tobacco use: No   MOLE ON LEFT HAND Has not changed since last visit.  Patient does think it occasionally looks dark.  Hurts if she brushes against it or hits it.  Excised today.    ROS: Per HPI  History  Smoking status  . Never Smoker   Smokeless tobacco  . Never Used    O:  Filed Vitals:   02/14/12 1326  BP: 130/80  Pulse: 64  Temp: 98.4 F (36.9 C)    Gen: NAD CV: RRR, no murmur Ext: Warm, no chronic skin changes, no edema   A/P: 56 y.o. female p/w changing mole, HTN -See problem list -f/u in 2 days for wound check     PROCEDURE NOTE Informed consent was obtained by patient for excisional biopsy of changing mole on left dorsal ulnar aspect of hand.  Procedure, including risks and benefits were clearly explained to the pt and all questions were answered.  Time out was taken.  Left hand was cleaned with alcohol and 4cc of 5% lidocaine with epinephrine was injected at the base of the mole.  Area was then cleaned with betadine and draped in a sterile fashion.  Sterile technique was used throughout the procedure.  Elliptical incision was made around the 5mm mole using an 11 blade scalpel.  Specimen was sent for pathology.  Hemostasis was achieved using 4-0 ethicon.  4 sutures were placed.  Sterile pressure dressing was placed.  Patient will follow up in 2 days for wound check and suture removal in 7 days.

## 2012-02-14 NOTE — Assessment & Plan Note (Signed)
Removed today, sent to pathology.  F/u 2 days for wound check, 7 days for suture removal.

## 2012-02-14 NOTE — Patient Instructions (Signed)
It was great to see you today! I will send you a letter with the pathology results from your mole.  Come back on FRIDAY to have your hand looked at.  Leave the dressing on that area until you are seen. You will need to come back next Wednesday to have your sutures taken out. Keep taking your blood pressure medicine, your pressure looks better today.  Come back and see me in 1 month to recheck your blood pressure.

## 2012-02-16 ENCOUNTER — Encounter: Payer: Self-pay | Admitting: Family Medicine

## 2012-02-16 ENCOUNTER — Ambulatory Visit (INDEPENDENT_AMBULATORY_CARE_PROVIDER_SITE_OTHER): Payer: Self-pay | Admitting: Family Medicine

## 2012-02-16 VITALS — BP 146/82 | HR 81 | Temp 98.6°F | Ht 67.0 in | Wt 232.0 lb

## 2012-02-16 DIAGNOSIS — D2360 Other benign neoplasm of skin of unspecified upper limb, including shoulder: Secondary | ICD-10-CM

## 2012-02-16 NOTE — Patient Instructions (Signed)
Please come back some time next week to get your stitches removed.   It was nice to meet you.

## 2012-02-16 NOTE — Assessment & Plan Note (Signed)
Discussed pathology results showing dermatofibroma with patient.  Site of excision seems to be healing well. Patient advised to follow-up next week for suture removal.

## 2012-02-16 NOTE — Progress Notes (Signed)
  Subjective:    Patient ID: Hannah Decker, female    DOB: 10-10-1956, 56 y.o.   MRN: [id number]  HPI Follow-up of skin lesion removal of left hand 04/03. Patient has no complaints. Denies fevers/chills or significant pain in left hand.  Review of Systems Per HPI    Objective:   Physical Exam Gen: NAD Skin: 1cm lesion that appears to be healing well without drainage or erythema; stitches are intact    Assessment & Plan:

## 2012-02-21 ENCOUNTER — Ambulatory Visit (INDEPENDENT_AMBULATORY_CARE_PROVIDER_SITE_OTHER): Payer: Self-pay | Admitting: *Deleted

## 2012-02-21 DIAGNOSIS — Z4802 Encounter for removal of sutures: Secondary | ICD-10-CM

## 2012-02-21 NOTE — Progress Notes (Signed)
In for suture removal   of wound site from lesion that was removed last week. Wound is superficially opened . No redness or drainage. Dr. Earnest Bailey came in to look at wound and advised OK to remove sutures at this time.  Four sutures removed  Wound cleaned well with sterile saline. Bacitracin ointment applied . Bandaid applied.

## 2012-03-22 ENCOUNTER — Other Ambulatory Visit: Payer: Self-pay | Admitting: Family Medicine

## 2012-05-29 ENCOUNTER — Ambulatory Visit (INDEPENDENT_AMBULATORY_CARE_PROVIDER_SITE_OTHER): Payer: Self-pay | Admitting: Family Medicine

## 2012-05-29 ENCOUNTER — Encounter: Payer: Self-pay | Admitting: Family Medicine

## 2012-05-29 VITALS — BP 151/84 | HR 72 | Ht 67.0 in | Wt 220.0 lb

## 2012-05-29 DIAGNOSIS — B999 Unspecified infectious disease: Secondary | ICD-10-CM | POA: Insufficient documentation

## 2012-05-29 DIAGNOSIS — B350 Tinea barbae and tinea capitis: Secondary | ICD-10-CM

## 2012-05-29 MED ORDER — CEPHALEXIN 500 MG PO CAPS: 500.0000 mg | ORAL_CAPSULE | Freq: Four times a day (QID) | ORAL | Status: AC

## 2012-05-29 MED ORDER — GRISEOFULVIN MICROSIZE 500 MG PO TABS: 500.0000 mg | ORAL_TABLET | Freq: Every day | ORAL | Status: DC

## 2012-05-29 NOTE — Assessment & Plan Note (Signed)
Will start griseofulvin daily x6 weeks.  Counseled on not sharing hair products.

## 2012-05-29 NOTE — Patient Instructions (Signed)
It was good to see you today.  I think the rash on your scalp is a fungal infection, now with a bacterial infection on top of it. I am going to treat you with two different medicines-- 1 for the fungus, which you will take every day for 6 WEEKS and 1 for the bacterial, which you need to take FOUR TIMES a day for 10 days. STOP using hair products until this has cleared up. Scrub your head with a tooth brush to get rid of some of the build up.  Come back and see me as needed for this.   Ringworm of the Scalp Tinea Capitis is also called scalp ringworm. It is a fungal infection of the skin on the scalp seen mainly in children.  CAUSES  Scalp ringworm spreads from:  Other people.   Pets (cats and dogs) and animals.   Bedding, hats, combs or brushes shared with an infected person   Theater seats that an infected person sat in.  SYMPTOMS  Scalp ringworm causes the following symptoms:  Flaky scales that look like dandruff.   Circles of thick, raised red skin.   Hair loss.   Red pimples or pustules.   Swollen glands in the back of the neck.   Itching.  DIAGNOSIS  A skin scraping or infected hairs will be sent to test for fungus. Testing can be done either by looking under the microscope (KOH examination) or by doing a culture (test to try to grow the fungus). A culture can take up to 2 weeks to come back. TREATMENT   Scalp ringworm must be treated with medicine by mouth to kill the fungus for 6 to 8 weeks.   Medicated shampoos (ketoconazole or selenium sulfide shampoo) may be used to decrease the shedding of fungal spores from the scalp.   Steroid medicines are used for severe cases that are very inflamed in conjunction with antifungal medication.   It is important that any family members or pets that have the fungus be treated.  HOME CARE INSTRUCTIONS   Be sure to treat the rash completely - follow your caregiver's instructions. It can take a month or more to treat. If you  do not treat it long enough, the rash can come back.   Watch for other cases in your family or pets.   Do not share brushes, combs, barrettes, or hats. Do not share towels.   Combs, brushes, and hats should be cleaned carefully and natural bristle brushes must be thrown away.   It is not necessary to shave the scalp or wear a hat during treatment.   Children may attend school once they start treatment with the oral medicine.   Be sure to follow up with your caregiver as directed to be sure the infection is gone.  SEEK MEDICAL CARE IF:   Rash is worse.   Rash is spreading.   Rash returns after treatment is completed.   The rash is not better in 2 weeks with treatment. Fungal infections are slow to respond to treatment. Some redness may remain for several weeks after the fungus is gone.  SEEK IMMEDIATE MEDICAL CARE IF:  The area becomes red, warm, tender, and swollen.   Pus is oozing from the rash.   You or your child has an oral temperature above 102 F (38.9 C), not controlled by medicine.  Document Released: 10/27/2000 Document Revised: 10/19/2011 Document Reviewed: 12/09/2008 [facility] Patient Information 2012 [place], [place].

## 2012-05-29 NOTE — Progress Notes (Signed)
S: Pt comes in today for itchy scalp. Patient seen and examined with Lenise Herald, MS3.  Briefly, 56 yo F p/w 2 weeks of itching on her scalp.  Noticed some bumps after using a new hair product.  Has been itching at her scalp, then noticed some pus or drainage and decided to come in.  Denies fevers/chills, N/V/D.  No rash anywhere else.  Has never had this before.  Has not used anything on the rash itself, but has continued to use hair care products.  Has stopped the new product.    ROS: Per HPI  History  Smoking status  . Never Smoker   Smokeless tobacco  . Never Used    O:  Filed Vitals:   05/29/12 1018  BP: 151/84  Pulse: 72    Gen: NAD HEENT: MMM, thickened scales over anterior scale with some dried pus, obvious itching, diffuse scaling; mild shotty posterior/periauricular LNs that are nontender Ext: Warm, no chronic skin changes, no edema   A/P: 56 y.o. female p/w tine capitis and overlying superinfection of scalp -See problem list -f/u in PRN

## 2012-05-29 NOTE — Assessment & Plan Note (Signed)
Likely from scratching tinea.  Counseled on no hair products, not scratching.  Keflex x10 days to treat.  Has h/o rash with PCN, counseled on reasons to stop abx including rash, SOB, throat swelling, etc.

## 2012-05-29 NOTE — Progress Notes (Signed)
Subjective:     Patient ID: Hannah Decker, female   DOB: [birth year], 56 y.o.   MRN: [id number]  HPI Hannah Decker is a 56 y/o female with a history of hypertension and dermatofibroma who comes to clinic today with a complaint of "bumps on her head."   States that the bumps started about 2 weeks ago. She started a new hair product about a month ago and thought it could have been related to that. Said the bumps were itchy, so she was scratching a lot.  Recently she noticed that the bumps had began weeping, and noticed some pus coming from them.  She states the lymph nodes behind her ears became swollen and hurt, but they have decreased in size since then and are no longer tender.   She denies fever, chills, pain or burning at the site of the bumps, dizziness, or other associated symptoms.  Review of Systems As per above.     Objective:   Physical Exam General: Well appearing, no acute distress  HEENT: NCAT, post-auricular lymph nodes are enlarged, non-tender.  Scalp: diffuse scaling on the top of the scalp with a 2x2 eschar located medially with dried pus surrounding .   Assessment:     56 y/o woman with itching and bumps on her scalp. Given scaling appearance on physical exam with a central, purulent lesion likely represents tinea capitis with bacterial superinfection.      Plan:     1. Itching scalp: Will prescribe griseofulvin for tinea capitis, and keflex to treat the superinfection. Instructed to return to clinic if wound does not appear to be healing or if she experiences fever.

## 2012-06-10 ENCOUNTER — Telehealth: Payer: Self-pay | Admitting: Family Medicine

## 2012-06-10 MED ORDER — TERBINAFINE HCL 250 MG PO TABS: 250.0000 mg | ORAL_TABLET | Freq: Every day | ORAL | Status: AC

## 2012-06-10 NOTE — Telephone Encounter (Signed)
Sent. Thanks.   

## 2012-06-10 NOTE — Telephone Encounter (Signed)
Forward to PCP for cheaper Rx.Hannah Decker  

## 2012-06-10 NOTE — Telephone Encounter (Signed)
Called pt back. Pt wants [facility] ([place]) .Arlyss Repress

## 2012-06-10 NOTE — Telephone Encounter (Signed)
Cannot afford griseofulvin (GRIFULVIN V) 500 MG tablet  and needs something cheaper called into [facility]

## 2012-06-10 NOTE — Telephone Encounter (Signed)
HD won't have appropriate treatment.  I can call in something different that will be on the $4 list at [facility].  Please call pt and ask which [facility] she would like it sent to. Thanks!

## 2014-11-11 ENCOUNTER — Encounter: Payer: Self-pay | Admitting: Family Medicine

## 2014-11-11 ENCOUNTER — Ambulatory Visit (INDEPENDENT_AMBULATORY_CARE_PROVIDER_SITE_OTHER): Payer: Self-pay | Admitting: Family Medicine

## 2014-11-11 VITALS — BP 138/92 | HR 82 | Temp 98.8°F | Ht 67.0 in | Wt 215.6 lb

## 2014-11-11 DIAGNOSIS — R221 Localized swelling, mass and lump, neck: Secondary | ICD-10-CM

## 2014-11-11 DIAGNOSIS — E041 Nontoxic single thyroid nodule: Secondary | ICD-10-CM

## 2014-11-11 DIAGNOSIS — I10 Essential (primary) hypertension: Secondary | ICD-10-CM

## 2014-11-11 MED ORDER — METOPROLOL TARTRATE 50 MG PO TABS: 50.0000 mg | ORAL_TABLET | Freq: Two times a day (BID) | ORAL | Status: DC

## 2014-11-11 NOTE — Patient Instructions (Signed)
For your blood pressure, I want to switch you to metoprolol which is from a different class of drugs and should not upset your stomach.  We are also going to get an ultrasound of your neck to get more information about the lump.  I would like to see you back in 3 months or sooner if you are having any problems.

## 2014-11-19 NOTE — Progress Notes (Signed)
   Subjective:    Patient ID: Hannah Decker, female    DOB: August 04, 1956, 59 y.o.   MRN: [id number]  HPI Pt presents for eval of neck lump that has been present for approximately 2 months. Her daughter first noticed it and pointed it out to her, since then it seems less prominent perhaps but certainly has not grown. She has no pain in the area. She has no trouble swallowing or breathing.   Review of Systems No fever, headaches, dizziness, SOB    Objective:   Physical Exam  Constitutional: She is oriented to person, place, and time. She appears well-developed and well-nourished. No distress.  HENT:  Head: Normocephalic and atraumatic.  Eyes: Conjunctivae are normal. Right eye exhibits no discharge. Left eye exhibits no discharge. No scleral icterus.  Neck: Normal range of motion. Neck supple. No thyroid mass and no thyromegaly present.    Cardiovascular: Normal rate, regular rhythm, normal heart sounds and intact distal pulses.   No murmur heard. Pulmonary/Chest: Effort normal and breath sounds normal. No respiratory distress. She has no wheezes.  Abdominal: She exhibits no distension.  Musculoskeletal: She exhibits no edema.  Neurological: She is alert and oriented to person, place, and time.  Skin: Skin is warm and dry. She is not diaphoretic.  Psychiatric: She has a normal mood and affect. Her behavior is normal.  Nursing note and vitals reviewed.         Assessment & Plan:

## 2014-11-19 NOTE — Assessment & Plan Note (Signed)
Long history of noncompliance. Reports unpleasant side affects from all drugs tried - counseled on importance of blood pressure control - trial of metoprolol (beta blockers one of few classes that have not been tried yet)

## 2014-11-19 NOTE — Assessment & Plan Note (Signed)
2cm mobile nodule on left lateral neck. Likely cyst vs. lipoma vs possible enlarged lymph node - eval further with ultrasound - f/u to be determined following this

## 2014-11-23 ENCOUNTER — Ambulatory Visit (HOSPITAL_COMMUNITY)
Admission: RE | Admit: 2014-11-23 | Discharge: 2014-11-23 | Disposition: A | Discharge status: Home / Self Care | Payer: Self-pay | Source: Ambulatory Visit | Attending: Family Medicine | Admitting: Family Medicine

## 2014-11-23 DIAGNOSIS — E042 Nontoxic multinodular goiter: Secondary | ICD-10-CM | POA: Insufficient documentation

## 2014-11-24 ENCOUNTER — Telehealth: Payer: Self-pay | Admitting: *Deleted

## 2014-11-24 NOTE — Addendum Note (Signed)
Addended by: Frazier Richards on: 11/24/2014 10:20 AM   Modules accepted: Orders

## 2014-11-24 NOTE — Telephone Encounter (Signed)
-----   Message from Frazier Richards, MD sent at 11/24/2014 10:17 AM EST ----- Please schedule a thyroid nodule ultrasound guided FNA biopsy with IR for this woman. After that please call the patient to let her know that there is a large growth in her thyroid and we need to get a biopsy of it to tell wether it is anything serious and let her know when/where to go. I would also like to see her back in clinic about a week after the biopsy to discuss the results and get lab work.  Thanks!

## 2014-11-24 NOTE — Telephone Encounter (Signed)
Left message on voicemail for patient to call back. FNA scheduled for 1/27 at 3:25pm at [place] ([address] location). Will give patient price details when she calls back.

## 2014-11-24 NOTE — Telephone Encounter (Signed)
Patient informed of message from MD, and of appointment details including price for procedure ($1,007) with a 60% discount if she decides to pay the day of service.

## 2014-12-09 ENCOUNTER — Other Ambulatory Visit (HOSPITAL_COMMUNITY)
Admission: RE | Admit: 2014-12-09 | Discharge: 2014-12-09 | Disposition: A | Discharge status: Home / Self Care | Payer: No Typology Code available for payment source | Source: Ambulatory Visit | Attending: Diagnostic Radiology | Admitting: Diagnostic Radiology

## 2014-12-09 ENCOUNTER — Ambulatory Visit
Admission: RE | Admit: 2014-12-09 | Discharge: 2014-12-09 | Disposition: A | Discharge status: Home / Self Care | Payer: No Typology Code available for payment source | Source: Ambulatory Visit | Attending: Family Medicine | Admitting: Family Medicine

## 2014-12-09 DIAGNOSIS — E041 Nontoxic single thyroid nodule: Secondary | ICD-10-CM

## 2014-12-16 ENCOUNTER — Telehealth: Payer: Self-pay | Admitting: *Deleted

## 2014-12-16 NOTE — Telephone Encounter (Signed)
-----   Message from Frazier Richards, MD sent at 12/14/2014 12:03 PM EST ----- Please inform patient that her neck lump is a benign thyroid nodule (noncancerous). I would like her to come in later this month to have her thyroid hormone level checked but other than that there is nothing that needs to be done at this time.

## 2014-12-16 NOTE — Telephone Encounter (Signed)
Left message on voicemail for patient to call back. 

## 2014-12-18 NOTE — Telephone Encounter (Signed)
Pt is returning our call. jw

## 2014-12-18 NOTE — Telephone Encounter (Signed)
Pt scheduled for a lab appt. Damyra Luscher Kennon Holter, CMA

## 2014-12-23 ENCOUNTER — Other Ambulatory Visit: Payer: No Typology Code available for payment source

## 2015-03-03 ENCOUNTER — Ambulatory Visit (INDEPENDENT_AMBULATORY_CARE_PROVIDER_SITE_OTHER): Payer: Self-pay | Admitting: Family Medicine

## 2015-03-03 ENCOUNTER — Encounter: Payer: Self-pay | Admitting: Family Medicine

## 2015-03-03 VITALS — BP 191/84 | HR 61 | Temp 98.2°F | Ht 67.0 in | Wt 222.7 lb

## 2015-03-03 DIAGNOSIS — I10 Essential (primary) hypertension: Secondary | ICD-10-CM

## 2015-03-03 DIAGNOSIS — E041 Nontoxic single thyroid nodule: Secondary | ICD-10-CM

## 2015-03-03 NOTE — Patient Instructions (Signed)
For your thyroid I would like to do some lab tests today. We will call with the results in a few days.  For your throat pain honey, salt water gargles and chloraseptic spray may help it feel better.  For your blood pressure, please continue to take metoprolol. It can be crushed to prevent it getting stuck.

## 2015-03-03 NOTE — Assessment & Plan Note (Signed)
BP very elevated today, 191/84, patient reports not taking meds because they get stuck in her throat - discussed strategies and patient agrees to crush pills and resume taking metop

## 2015-03-03 NOTE — Progress Notes (Signed)
   Subjective:    Patient ID: Hannah Decker, female    DOB: 1956/03/13, 59 y.o.   MRN: [id number]  HPI Pt presents for f/u of thyroid nodule. When I last saw her I did not think the nodule was in her thyroid based on how lateral it was but ultrasound showed multinodular goiter with large dominant nodule. Ultrasound-guided biopsy revealed this was a benign follicular nodule. Because I did not think it was in her thyroid she has not had thyroid studies. She has no symptoms of hypo or hyperthyroidism.   Review of Systems See HPI Also endorses sore throat and cough    Objective:   Physical Exam  Constitutional: She is oriented to person, place, and time. She appears well-developed and well-nourished. No distress.  HENT:  Head: Normocephalic and atraumatic.  Mouth/Throat: Uvula is midline and mucous membranes are normal. Posterior oropharyngeal erythema present. No oropharyngeal exudate, posterior oropharyngeal edema or tonsillar abscesses.  Neck: Normal range of motion. Neck supple. No tracheal deviation present. Thyromegaly present.  3cm palpable thyroid nodule on left side of neck  Cardiovascular: Normal rate.   Pulmonary/Chest: Effort normal. No respiratory distress.  Abdominal: She exhibits no distension.  Lymphadenopathy:    She has no cervical adenopathy.  Neurological: She is alert and oriented to person, place, and time.  Skin: Skin is warm and dry. No rash noted. She is not diaphoretic.  Psychiatric: She has a normal mood and affect. Her behavior is normal.  Nursing note and vitals reviewed.         Assessment & Plan:

## 2015-03-03 NOTE — Assessment & Plan Note (Signed)
Multinodular goiter with large dominant nodule recently biopsied and benign. Has not yet had thyroid studies. - TSH, TPO and FT4 today

## 2015-03-04 LAB — THYROID PEROXIDASE ANTIBODY: Thyroperoxidase Ab SerPl-aCnc: <1 IU/mL (ref <9)

## 2015-03-04 LAB — TSH: TSH: 3.081 u[IU]/mL (ref 0.350–4.500)

## 2015-03-04 LAB — T4, FREE: Free T4: 0.88 ng/dL (ref 0.80–1.80)

## 2015-03-05 ENCOUNTER — Encounter: Payer: Self-pay | Admitting: *Deleted

## 2015-09-24 ENCOUNTER — Ambulatory Visit (INDEPENDENT_AMBULATORY_CARE_PROVIDER_SITE_OTHER): Payer: Self-pay | Admitting: Family Medicine

## 2015-09-24 ENCOUNTER — Encounter: Payer: Self-pay | Admitting: Family Medicine

## 2015-09-24 VITALS — BP 182/93 | HR 72 | Temp 98.2°F | Ht 67.0 in | Wt 224.0 lb

## 2015-09-24 DIAGNOSIS — J3489 Other specified disorders of nose and nasal sinuses: Secondary | ICD-10-CM

## 2015-09-24 DIAGNOSIS — Z23 Encounter for immunization: Secondary | ICD-10-CM

## 2015-09-24 MED ORDER — MUPIROCIN CALCIUM 2 % NA OINT: TOPICAL_OINTMENT | NASAL | Status: DC

## 2015-09-24 NOTE — Patient Instructions (Signed)
Sent in bactroban nasal ointment for you Try this to see if it gets better If not, may need to go see ear nose and throat doctor  Please follow up with Dr. Sherril Cong for your blood pressure in the next couple of weeks  Be well, Dr. Ardelia Mems

## 2015-09-26 DIAGNOSIS — J3489 Other specified disorders of nose and nasal sinuses: Secondary | ICD-10-CM | POA: Insufficient documentation

## 2015-09-26 NOTE — Assessment & Plan Note (Signed)
No abnormalities on exam today. Discussed options including referral to ENT but patient prefers not to do that, as she has no insurance Will do trial of nasal mupirocin Follow up if not improving

## 2015-09-26 NOTE — Progress Notes (Signed)
Date of Visit: 09/24/2015   HPI:  Patient presents to discuss sores in nose. Has had pain in her nostrils for about 1 month. Always occur in the same spot, but come and go. Occasionally nose drains but generally not runny or congested. No fevers. Eating and drinking well. Does not smoke. No cough.   BP noted high - patient reports she ran out of her metoprolol and plans to go get it.   ROS: See HPI.  [place]: hypertension, allergic rhinitis, tinea capitis, thyroid nodule s/p biopsy with benign cytology  PHYSICAL EXAM: BP 182/93 mmHg  Pulse 72  Temp(Src) 98.2 F (36.8 C) (Oral)  Ht 5\' 7"  (1.702 m)  Wt 224 lb (101.606 kg)  BMI 35.08 kg/m2 Gen: NAD, pleasant, cooperative HEENT: normocephalic, atraumatic. moist mucous membranes. Oropharynx clear and moist. + left upper thyroid nodule, nontender to palpation. No anterior cervical lymphadenopathy. Nares patent without discharge. No lesions seen.   ASSESSMENT/PLAN:  Nasal sore No abnormalities on exam today. Discussed options including referral to ENT but patient prefers not to do that, as she has no insurance Will do trial of nasal mupirocin Follow up if not improving   FOLLOW UP: F/u in next few weeks with PCP for hypertension  F/u as needed if nose symptoms worsen or do not improve.  [place]. Ardelia Mems, [place]

## 2019-01-13 ENCOUNTER — Encounter (HOSPITAL_COMMUNITY): Payer: Self-pay | Admitting: Emergency Medicine

## 2019-01-13 ENCOUNTER — Ambulatory Visit (HOSPITAL_COMMUNITY)
Admission: EM | Admit: 2019-01-13 | Discharge: 2019-01-13 | Disposition: A | Discharge status: Home / Self Care | Payer: No Typology Code available for payment source | Attending: Family Medicine | Admitting: Family Medicine

## 2019-01-13 DIAGNOSIS — N898 Other specified noninflammatory disorders of vagina: Secondary | ICD-10-CM | POA: Insufficient documentation

## 2019-01-13 DIAGNOSIS — I10 Essential (primary) hypertension: Secondary | ICD-10-CM

## 2019-01-13 HISTORY — DX: Essential (primary) hypertension: I10

## 2019-01-13 LAB — POCT URINALYSIS DIP (DEVICE)
BILIRUBIN URINE: NEGATIVE
Glucose, UA: NEGATIVE mg/dL
Ketones, ur: NEGATIVE mg/dL
NITRITE: NEGATIVE
PH: 5.5 (ref 5.0–8.0)
Protein, ur: NEGATIVE mg/dL
Specific Gravity, Urine: 1.02 (ref 1.005–1.030)
Urobilinogen, UA: 0.2 mg/dL (ref 0.0–1.0)

## 2019-01-13 MED ORDER — METRONIDAZOLE 500 MG PO TABS: 500.0000 mg | ORAL_TABLET | Freq: Two times a day (BID) | ORAL | 0 refills | Status: AC

## 2019-01-13 NOTE — ED Triage Notes (Addendum)
Pt here for vaginal discharge x several weeks per pt; pt sts no htn meds x 2 years

## 2019-01-13 NOTE — Discharge Instructions (Signed)
Please begin metronidazole twice daily for the next week, do not drink alcohol until finishing the medicine  We are testing you for Gonorrhea, Chlamydia, Trichomonas, Yeast and Bacterial Vaginosis. We will call you if anything is positive and let you know if you require any further treatment. Please inform partners of any positive results.   Please return if symptoms not improving with treatment, development of fever, nausea, vomiting, abdominal pain.

## 2019-01-13 NOTE — ED Provider Notes (Signed)
[place]    CSN: [id number] Arrival date & time: 01/13/19  1048     History   Chief Complaint Chief Complaint  Patient presents with  . Vaginal Discharge    HPI Hannah Decker is a 63 y.o. female history of hypertension, presenting today for evaluation of vaginal discharge.  Patient states she has had discharge for the past several weeks.  She is started to develop some mild upset stomach and lower abdominal discomfort.  She denies any itching ; does endorse some slight irritation.  Feels similar to when she is previously had a "bacterial infection".  Denies any new partners.  Is still getting monthly light cycles.  Denies recent changes in soaps.  Denies fevers.  HPI  Past Medical History:  Diagnosis Date  . Hypertension     Patient Active Problem List   Diagnosis Date Noted  . Nasal sore 09/26/2015  . Thyroid nodule 11/11/2014  . Tinea capitis 05/29/2012  . Dermatofibroma of hand 02/14/2012  . Mole (skin) 01/15/2012  . Allergic rhinitis 10/12/2011  . Well woman exam 08/16/2011  . ABSCESS, TOOTH 05/27/2010  . UNSPECIFIED VISUAL LOSS 03/22/2010  . BACK PAIN, LUMBAR 09/30/2008  . UTERINE FIBROID 01/10/2007  . OBESITY, NOS 01/10/2007  . HYPERTENSION, BENIGN SYSTEMIC 01/10/2007  . RHINITIS, ALLERGIC 01/10/2007    History reviewed. No pertinent surgical history.  OB History   No obstetric history on file.      Home Medications    Prior to Admission medications   Medication Sig Start Date End Date Taking? Authorizing Provider  cyclobenzaprine (FLEXERIL) 5 MG tablet Take 5 mg by mouth every 8 (eight) hours as needed. for pain or spasm     [provider]  diclofenac (VOLTAREN) 75 MG EC tablet Take 75 mg by mouth 2 (two) times daily. for pain     [provider]  fluticasone (FLONASE) 50 MCG/ACT nasal spray Place 2 sprays into the nose daily. 10/10/11 10/09/12  Deneise Lever, MD  loratadine (CLARITIN) 10 MG tablet Take 1 tablet (10  mg total) by mouth daily. 10/10/11 10/09/12  Deneise Lever, MD  metoprolol (LOPRESSOR) 50 MG tablet Take 1 tablet (50 mg total) by mouth 2 (two) times daily. 11/11/14   Frazier Richards, MD  metroNIDAZOLE (FLAGYL) 500 MG tablet Take 1 tablet (500 mg total) by mouth 2 (two) times daily for 7 days. 01/13/19 01/20/19  Audreyana Huntsberry C, PA-C  mupirocin nasal ointment (BACTROBAN) 2 % Use 1/2 of single use tube in each nostril twice daily for five (5) days. After application, press sides of nose together, gently massage. 09/24/15   Leeanne Rio, MD  traMADol (ULTRAM) 50 MG tablet Take 50 mg by mouth. every 4 to 6 hours as needed for pain     [provider]    Family History Family History  Problem Relation Age of Onset  . Hypertension Mother   . Diabetes Father   . Hypertension Father     Social History Social History   Tobacco Use  . Smoking status: Never Smoker  . Smokeless tobacco: Never Used  Substance Use Topics  . Alcohol use: No  . Drug use: No     Allergies   Ace inhibitors; Hydrochlorothiazide; Ibuprofen; and Penicillins   Review of Systems Review of Systems  Constitutional: Negative for fever.  Respiratory: Negative for shortness of breath.   Cardiovascular: Negative for chest pain.  Gastrointestinal: Positive for abdominal pain. Negative for diarrhea, nausea  and vomiting.  Genitourinary: Positive for vaginal discharge. Negative for dysuria, flank pain, genital sores, hematuria, menstrual problem, vaginal bleeding and vaginal pain.  Musculoskeletal: Negative for back pain.  Skin: Negative for rash.  Neurological: Negative for dizziness, light-headedness and headaches.     Physical Exam Triage Vital Signs ED Triage Vitals [01/13/19 1119]  Enc Vitals Group     BP (!) 173/93     Pulse Rate 63     Resp 18     Temp 98.1 F (36.7 C)     Temp Source Temporal     SpO2 100 %     Weight      Height      Head Circumference      Peak Flow      Pain  Score 5     Pain Loc      Pain Edu?      Excl. in [place]?    No data found.  Updated Vital Signs BP (!) 173/93 (BP Location: Right Arm)   Pulse 63   Temp 98.1 F (36.7 C) (Temporal)   Resp 18   SpO2 100%   Visual Acuity Right Eye Distance:   Left Eye Distance:   Bilateral Distance:    Right Eye Near:   Left Eye Near:    Bilateral Near:     Physical Exam Vitals signs and nursing note reviewed.  Constitutional:      General: She is not in acute distress.    Appearance: She is well-developed.  HENT:     Head: Normocephalic and atraumatic.  Eyes:     Conjunctiva/sclera: Conjunctivae normal.  Neck:     Musculoskeletal: Neck supple.  Cardiovascular:     Rate and Rhythm: Normal rate and regular rhythm.     Heart sounds: No murmur.  Pulmonary:     Effort: Pulmonary effort is normal. No respiratory distress.     Breath sounds: Normal breath sounds.  Abdominal:     Palpations: Abdomen is soft.     Tenderness: There is no abdominal tenderness.     Comments: Nontender light deep palpation throughout abdomen  Skin:    General: Skin is warm and dry.  Neurological:     Mental Status: She is alert.      UC Treatments / Results  Labs (all labs ordered are listed, but only abnormal results are displayed) Labs Reviewed  POCT URINALYSIS DIP (DEVICE) - Abnormal; Notable for the following components:      Result Value   Hgb urine dipstick TRACE (*)    Leukocytes,Ua TRACE (*)    All other components within normal limits  URINE CULTURE  CERVICOVAGINAL ANCILLARY ONLY    EKG None  Radiology No results found.  Procedures Procedures (including critical care time)  Medications Ordered in UC Medications - No data to display  Initial Impression / Assessment and Plan / UC Course  I have reviewed the triage vital signs and the nursing notes.  Pertinent labs & imaging results that were available during my care of the patient were reviewed by me and considered in my medical  decision making (see chart for details).     Abdominal exam negative for peritoneal signs, given history of BV will treat for this with metronidazole twice daily x1 week.  Swab obtained will send off to confirm, will call patient with results and alter treatment as needed.  Blood pressure elevated in clinic today, patient has been off her blood pressure medicines for a couple of  years and has been unable to reestablish care with a primary care.  She denies headaches, vision changes, chest pain or shortness of breath.  Advised to monitor blood pressure and follow-up if developing the symptoms.  Offered contact for primary care at [facility]  Discussed strict return precautions. Patient verbalized understanding and is agreeable with plan.  Final Clinical Impressions(s) / UC Diagnoses   Final diagnoses:  Vaginal discharge     Discharge Instructions     Please begin metronidazole twice daily for the next week, do not drink alcohol until finishing the medicine  We are testing you for Gonorrhea, Chlamydia, Trichomonas, Yeast and Bacterial Vaginosis. We will call you if anything is positive and let you know if you require any further treatment. Please inform partners of any positive results.   Please return if symptoms not improving with treatment, development of fever, nausea, vomiting, abdominal pain.    ED Prescriptions    Medication Sig Dispense Auth. Provider   metroNIDAZOLE (FLAGYL) 500 MG tablet Take 1 tablet (500 mg total) by mouth 2 (two) times daily for 7 days. 14 tablet Rand Etch[place], [place] C, PA-C     Controlled Substance Prescriptions [place] Controlled Substance Registry consulted? Not Applicable   Janith Lima, [place] 01/13/19 1252

## 2019-01-14 LAB — URINE CULTURE: Culture: 30000 — AB

## 2019-01-15 ENCOUNTER — Telehealth (HOSPITAL_COMMUNITY): Payer: Self-pay | Admitting: Emergency Medicine

## 2019-01-15 NOTE — Telephone Encounter (Signed)
Urine culture is positive for group B Strep germ, which is part of the normal urogenital germ flora and does not represent a UTI.

## 2019-01-16 LAB — CERVICOVAGINAL ANCILLARY ONLY
Bacterial vaginitis: POSITIVE — AB
CANDIDA VAGINITIS: NEGATIVE
Chlamydia: NEGATIVE
Neisseria Gonorrhea: NEGATIVE
TRICH (WINDOWPATH): NEGATIVE

## 2019-05-19 ENCOUNTER — Ambulatory Visit (HOSPITAL_COMMUNITY)
Admission: EM | Admit: 2019-05-19 | Discharge: 2019-05-19 | Disposition: A | Discharge status: Home / Self Care | Payer: No Typology Code available for payment source | Attending: Internal Medicine | Admitting: Internal Medicine

## 2019-05-19 ENCOUNTER — Encounter (HOSPITAL_COMMUNITY): Payer: Self-pay

## 2019-05-19 DIAGNOSIS — N950 Postmenopausal bleeding: Secondary | ICD-10-CM | POA: Insufficient documentation

## 2019-05-19 DIAGNOSIS — I10 Essential (primary) hypertension: Secondary | ICD-10-CM

## 2019-05-19 LAB — POCT URINALYSIS DIP (DEVICE)
Bilirubin Urine: NEGATIVE
Glucose, UA: NEGATIVE mg/dL
Ketones, ur: NEGATIVE mg/dL
Nitrite: NEGATIVE
Protein, ur: NEGATIVE mg/dL
Specific Gravity, Urine: >=1.03 (ref 1.005–1.030)
Urobilinogen, UA: 0.2 mg/dL (ref 0.0–1.0)
pH: 5.5 (ref 5.0–8.0)

## 2019-05-19 NOTE — ED Triage Notes (Signed)
Pt C/O abdominal pain and vaginal bleeding with some discharge. Symptoms started 3 wks ago.

## 2019-05-19 NOTE — Discharge Instructions (Signed)
We are testing you for potential infectious sources of vaginal bleeding.  Will notify you of any positive findings and if any changes to treatment are needed.  This test takes approximately 2 days to get back.  Please establish with a primary care provider and/or gynecologist for recheck as you likely will need further evaluation of the cause of your bleeding.  If you develop worsening of pain, fevers, dizzy or weakness, or otherwise worsening please go to the ER.

## 2019-05-19 NOTE — ED Provider Notes (Signed)
[place]    CSN: [id number] Arrival date & time: 05/19/19  1440      History   Chief Complaint Chief Complaint  Patient presents with  . Abdominal Pain  . Vaginal Bleeding    HPI Hannah Decker is a 63 y.o. female.   Hannah Decker presents with complaints of vaginal bleeding and discharge as well as occasional abdominal pain and nausea. States she feels nausea if hungry. She feels a "pin stick" sensation occasionally to her stomach. Denies currently. No vomiting or diarrhea. Symptoms started 3 weeks ago. Vaginal discharge has been light brown. She is not sexually active has had BV in the past. Small bleeding, she notes it primarily when she goes to the restroom. No dizziness. No fevers. No new back pain. She has not had a PAP since 2011. Has had an ovarian cyst, no other gynecological history. Chart review notes uterine fibroids but patient denies being aware of this. She has 4 children. Doesn't follow with a PCP due to insurance limitations.     ROS per HPI, negative if not otherwise mentioned.      Past Medical History:  Diagnosis Date  . Hypertension     Patient Active Problem List   Diagnosis Date Noted  . Nasal sore 09/26/2015  . Thyroid nodule 11/11/2014  . Tinea capitis 05/29/2012  . Dermatofibroma of hand 02/14/2012  . Mole (skin) 01/15/2012  . Allergic rhinitis 10/12/2011  . Well woman exam 08/16/2011  . ABSCESS, TOOTH 05/27/2010  . UNSPECIFIED VISUAL LOSS 03/22/2010  . BACK PAIN, LUMBAR 09/30/2008  . UTERINE FIBROID 01/10/2007  . OBESITY, NOS 01/10/2007  . HYPERTENSION, BENIGN SYSTEMIC 01/10/2007  . RHINITIS, ALLERGIC 01/10/2007    History reviewed. No pertinent surgical history.  OB History   No obstetric history on file.      Home Medications    Prior to Admission medications   Medication Sig Start Date End Date Taking? Authorizing Provider  cyclobenzaprine (FLEXERIL) 5 MG tablet Take 5 mg by mouth every 8 (eight) hours as  needed. for pain or spasm     [provider]  diclofenac (VOLTAREN) 75 MG EC tablet Take 75 mg by mouth 2 (two) times daily. for pain     [provider]  fluticasone (FLONASE) 50 MCG/ACT nasal spray Place 2 sprays into the nose daily. 10/10/11 10/09/12  Deneise Lever, MD  loratadine (CLARITIN) 10 MG tablet Take 1 tablet (10 mg total) by mouth daily. 10/10/11 10/09/12  Deneise Lever, MD  metoprolol (LOPRESSOR) 50 MG tablet Take 1 tablet (50 mg total) by mouth 2 (two) times daily. 11/11/14   Frazier Richards, MD  mupirocin nasal ointment (BACTROBAN) 2 % Use 1/2 of single use tube in each nostril twice daily for five (5) days. After application, press sides of nose together, gently massage. 09/24/15   Leeanne Rio, MD  traMADol (ULTRAM) 50 MG tablet Take 50 mg by mouth. every 4 to 6 hours as needed for pain     [provider]    Family History Family History  Problem Relation Age of Onset  . Hypertension Mother   . Diabetes Father   . Hypertension Father     Social History Social History   Tobacco Use  . Smoking status: Never Smoker  . Smokeless tobacco: Never Used  Substance Use Topics  . Alcohol use: No  . Drug use: No     Allergies   Ace inhibitors, Hydrochlorothiazide, Ibuprofen, and  Penicillins   Review of Systems Review of Systems   Physical Exam Triage Vital Signs ED Triage Vitals  Enc Vitals Group     BP 05/19/19 1513 (!) 170/83     Pulse Rate 05/19/19 1513 84     Resp 05/19/19 1513 16     Temp 05/19/19 1513 98.6 F (37 C)     Temp Source 05/19/19 1513 Oral     SpO2 05/19/19 1513 100 %     Weight --      Height --      Head Circumference --      Peak Flow --      Pain Score 05/19/19 1514 7     Pain Loc --      Pain Edu? --      Excl. in [place]? --    No data found.  Updated Vital Signs BP (!) 170/83 (BP Location: Right Arm)   Pulse 84   Temp 98.6 F (37 C) (Oral)   Resp 16   SpO2 100%   Visual Acuity Right  Eye Distance:   Left Eye Distance:   Bilateral Distance:    Right Eye Near:   Left Eye Near:    Bilateral Near:     Physical Exam Constitutional:      General: She is not in acute distress.    Appearance: She is well-developed.  Cardiovascular:     Rate and Rhythm: Normal rate.     Heart sounds: Normal heart sounds.  Pulmonary:     Effort: Pulmonary effort is normal.  Genitourinary:    Cervix: No cervical motion tenderness, lesion, erythema or eversion.     Comments: Scant pink discharge noted; mildly enlarge cervix with hyperpigmented markings; non tender and with minimal pain on bimanual exam. No adnexal pain.  Skin:    General: Skin is warm and dry.  Neurological:     Mental Status: She is alert and oriented to person, place, and time.      UC Treatments / Results  Labs (all labs ordered are listed, but only abnormal results are displayed) Labs Reviewed  POCT URINALYSIS DIP (DEVICE) - Abnormal; Notable for the following components:      Result Value   Hgb urine dipstick MODERATE (*)    Leukocytes,Ua TRACE (*)    All other components within normal limits  CERVICOVAGINAL ANCILLARY ONLY    EKG   Radiology No results found.  Procedures Procedures (including critical care time)  Medications Ordered in UC Medications - No data to display  Initial Impression / Assessment and Plan / UC Course  I have reviewed the triage vital signs and the nursing notes.  Pertinent labs & imaging results that were available during my care of the patient were reviewed by me and considered in my medical decision making (see chart for details).     Vaginal cytology in process. BV as source of bleeding? Will notify of any positive findings and if any changes to treatment are needed.  Encouraged recheck with gynecology as likely will need further evaluation. Return precautions provided. Patient verbalized understanding and agreeable to plan.    Patient review with supervising  physician Dr. Meda Coffee, with repeat pelvic exam completed.  Final Clinical Impressions(s) / UC Diagnoses   Final diagnoses:  Postmenopausal bleeding     Discharge Instructions     We are testing you for potential infectious sources of vaginal bleeding.  Will notify you of any positive findings and if any changes to treatment  are needed.  This test takes approximately 2 days to get back.  Please establish with a primary care provider and/or gynecologist for recheck as you likely will need further evaluation of the cause of your bleeding.  If you develop worsening of pain, fevers, dizzy or weakness, or otherwise worsening please go to the ER.    ED Prescriptions    None     Controlled Substance Prescriptions [place] Controlled Substance Registry consulted? Not Applicable   Zigmund Gottron, NP 05/19/19 [phone]

## 2019-05-21 LAB — CERVICOVAGINAL ANCILLARY ONLY
Bacterial vaginitis: NEGATIVE
Candida vaginitis: NEGATIVE
Chlamydia: NEGATIVE
Neisseria Gonorrhea: NEGATIVE
Trichomonas: NEGATIVE

## 2019-07-04 ENCOUNTER — Other Ambulatory Visit: Payer: Self-pay

## 2019-07-04 ENCOUNTER — Encounter: Payer: Self-pay | Admitting: Obstetrics & Gynecology

## 2019-07-04 ENCOUNTER — Ambulatory Visit (INDEPENDENT_AMBULATORY_CARE_PROVIDER_SITE_OTHER): Payer: Self-pay | Admitting: Obstetrics & Gynecology

## 2019-07-04 ENCOUNTER — Other Ambulatory Visit (HOSPITAL_COMMUNITY)
Admission: RE | Admit: 2019-07-04 | Discharge: 2019-07-04 | Disposition: A | Discharge status: Home / Self Care | Payer: No Typology Code available for payment source | Source: Ambulatory Visit | Attending: Obstetrics & Gynecology | Admitting: Obstetrics & Gynecology

## 2019-07-04 VITALS — BP 156/84 | HR 78 | Temp 98.6°F | Wt 223.4 lb

## 2019-07-04 DIAGNOSIS — N950 Postmenopausal bleeding: Secondary | ICD-10-CM | POA: Insufficient documentation

## 2019-07-04 DIAGNOSIS — C541 Malignant neoplasm of endometrium: Secondary | ICD-10-CM

## 2019-07-04 DIAGNOSIS — Z0000 Encounter for general adult medical examination without abnormal findings: Secondary | ICD-10-CM

## 2019-07-04 NOTE — Progress Notes (Addendum)
Called and left pt a voicemail with appointment details for the [facility] to establish care. Pt was provided with location of office and phone number. Informed pt appointment is scheduled for 10/5 @ 1:50. Pt was encouraged to call the [facility] at the number provided with questions regarding appointment details.

## 2019-07-04 NOTE — Progress Notes (Signed)
   Subjective:    Patient ID: Hannah Decker, female    DOB: 08-Oct-1956, 63 y.o.   MRN: [id number]  HPI 63 yo divorced P4 here for a EMBX due to a 1 year h/o PMB. She had an u/s done 2/20 to evaluate LLQ pain and it showed the endometrium to be 7.7 mm. It also showed a couple of fibroids.   Review of Systems     Objective:   Physical Exam Breathing, conversing, and ambulating normally Well nourished, well hydrated Black female, no apparent distress Abd- benign, obese Vulva, vagina, cervix- normal, some bloody mucous noted Bimanual- 10 week size mobile uterus, no decensus, wide pubic arch  UPT negative, consent signed, time out done Cervix prepped with betadine and grasped with a single tooth tenaculum Uterus sounded to 10 cm Pipelle used for 3 passes with a large amount of tissue obtained. She tolerated the procedure well.      Assessment & Plan:  PMB- await pathology She will come back in 2 weeks for results Refer to fam [facility] for HTN general medical care

## 2019-07-07 ENCOUNTER — Encounter: Payer: Self-pay | Admitting: General Practice

## 2019-07-07 ENCOUNTER — Other Ambulatory Visit: Payer: Self-pay | Admitting: Obstetrics & Gynecology

## 2019-07-07 DIAGNOSIS — C541 Malignant neoplasm of endometrium: Secondary | ICD-10-CM

## 2019-07-07 NOTE — Progress Notes (Signed)
I called to give her pathology results and got her voicemail. I gave her my cell phone number and told her to call me. I placed an order for referral to gyn onc.

## 2019-07-11 ENCOUNTER — Telehealth: Payer: Self-pay | Admitting: *Deleted

## 2019-07-11 ENCOUNTER — Other Ambulatory Visit: Payer: Self-pay

## 2019-07-11 ENCOUNTER — Encounter (HOSPITAL_COMMUNITY): Payer: Self-pay | Admitting: Emergency Medicine

## 2019-07-11 ENCOUNTER — Ambulatory Visit (HOSPITAL_COMMUNITY)
Admission: EM | Admit: 2019-07-11 | Discharge: 2019-07-11 | Disposition: A | Discharge status: Home / Self Care | Payer: Self-pay | Attending: Emergency Medicine | Admitting: Emergency Medicine

## 2019-07-11 DIAGNOSIS — R110 Nausea: Secondary | ICD-10-CM

## 2019-07-11 DIAGNOSIS — K219 Gastro-esophageal reflux disease without esophagitis: Secondary | ICD-10-CM

## 2019-07-11 DIAGNOSIS — R1012 Left upper quadrant pain: Secondary | ICD-10-CM

## 2019-07-11 MED ORDER — ONDANSETRON HCL 4 MG PO TABS: 4.0000 mg | ORAL_TABLET | Freq: Four times a day (QID) | ORAL | 0 refills | Status: DC

## 2019-07-11 MED ORDER — OMEPRAZOLE 20 MG PO CPDR: 20.0000 mg | DELAYED_RELEASE_CAPSULE | Freq: Every day | ORAL | 0 refills | Status: DC

## 2019-07-11 NOTE — ED Triage Notes (Signed)
Pt sts left sided upper abd pain x weeks; pt sts worse after drinking water; pt sts decrease in appetite at times

## 2019-07-11 NOTE — Discharge Instructions (Signed)
°  Please follow up with GYN and primary care as previously scheduled.  Call 911 or go to the hospital if symptoms worsen- worsening pain, blood in vomit, dark or black stool, fever, or other new concerning symptoms develop.

## 2019-07-11 NOTE — ED Provider Notes (Signed)
[place]    CSN: [id number] Arrival date & time: 07/11/19  1026      History   Chief Complaint Chief Complaint  Patient presents with  . Abdominal Pain    HPI Hannah Decker is a 63 y.o. female.   HPI Hannah Decker is a 63 y.o. female presenting to UC with c/o Left upper abdominal pain for about 2-3 weeks, worse with drinking water but not usually with other drinks.  Pain is sore and burning at times after drinking.  Decreased appetite and nausea most recently over the last 2-3 days.  Pain is 6/10 at worst. Denies fever, chills, vomiting, diarrhea or urinary symptoms.  She was recently dx with endometrial cancer, she was referred to Dr. Alycia Rossetti, GYN Onc. And has f/u on 07/15/2019.  She was also referred to [facility] to establish care with PCP for upper abdominal symptoms but cannot see them until October.  She was hoping to get nausea medication to help until her f/u with PCP.    Past Medical History:  Diagnosis Date  . Hypertension     Patient Active Problem List   Diagnosis Date Noted  . PMB (postmenopausal bleeding) 07/04/2019  . Nasal sore 09/26/2015  . Thyroid nodule 11/11/2014  . Tinea capitis 05/29/2012  . Dermatofibroma of hand 02/14/2012  . Mole (skin) 01/15/2012  . Allergic rhinitis 10/12/2011  . Well woman exam 08/16/2011  . ABSCESS, TOOTH 05/27/2010  . UNSPECIFIED VISUAL LOSS 03/22/2010  . BACK PAIN, LUMBAR 09/30/2008  . UTERINE FIBROID 01/10/2007  . Obesity 01/10/2007  . HYPERTENSION, BENIGN SYSTEMIC 01/10/2007  . RHINITIS, ALLERGIC 01/10/2007    Past Surgical History:  Procedure Laterality Date  . HERNIA REPAIR    . TUBAL LIGATION      OB History    Gravida  4   Para  4   Term  4   Preterm      AB      Living  4     SAB      TAB      Ectopic      Multiple      Live Births               Home Medications    Prior to Admission medications   Medication Sig Start Date End Date Taking?  Authorizing Provider  cyclobenzaprine (FLEXERIL) 5 MG tablet Take 5 mg by mouth every 8 (eight) hours as needed. for pain or spasm     [provider]  diclofenac (VOLTAREN) 75 MG EC tablet Take 75 mg by mouth 2 (two) times daily. for pain     [provider]  fluticasone (FLONASE) 50 MCG/ACT nasal spray Place 2 sprays into the nose daily. 10/10/11 10/09/12  Deneise Lever, MD  loratadine (CLARITIN) 10 MG tablet Take 1 tablet (10 mg total) by mouth daily. 10/10/11 10/09/12  Deneise Lever, MD  metoprolol (LOPRESSOR) 50 MG tablet Take 1 tablet (50 mg total) by mouth 2 (two) times daily. Patient not taking: Reported on 07/04/2019 11/11/14   Frazier Richards, MD  mupirocin nasal ointment (BACTROBAN) 2 % Use 1/2 of single use tube in each nostril twice daily for five (5) days. After application, press sides of nose together, gently massage. Patient not taking: Reported on 07/04/2019 09/24/15   Leeanne Rio, MD  omeprazole (PRILOSEC) 20 MG capsule Take 1 capsule (20 mg total) by mouth daily. 07/11/19   Noe Gens,  PA-C  ondansetron (ZOFRAN) 4 MG tablet Take 1 tablet (4 mg total) by mouth every 6 (six) hours. 07/11/19   Noe Gens, PA-C  traMADol (ULTRAM) 50 MG tablet Take 50 mg by mouth. every 4 to 6 hours as needed for pain     [provider]    Family History Family History  Problem Relation Age of Onset  . Hypertension Mother   . Diabetes Father   . Hypertension Father     Social History Social History   Tobacco Use  . Smoking status: Never Smoker  . Smokeless tobacco: Never Used  Substance Use Topics  . Alcohol use: No  . Drug use: No     Allergies   Ace inhibitors, Hydrochlorothiazide, Ibuprofen, and Penicillins   Review of Systems Review of Systems  Constitutional: Positive for appetite change. Negative for chills and fever.  HENT: Negative for congestion, ear pain, sore throat, trouble swallowing and voice change.   Respiratory:  Negative for cough and shortness of breath.   Cardiovascular: Negative for chest pain and palpitations.  Gastrointestinal: Positive for abdominal pain and nausea. Negative for diarrhea and vomiting.  Genitourinary: Negative for dysuria, flank pain, frequency and hematuria.  Musculoskeletal: Negative for arthralgias, back pain and myalgias.  Skin: Negative for rash.     Physical Exam Triage Vital Signs ED Triage Vitals [07/11/19 1106]  Enc Vitals Group     BP (!) 185/96     Pulse Rate 69     Resp 18     Temp 98.2 F (36.8 C)     Temp Source Oral     SpO2 99 %     Weight      Height      Head Circumference      Peak Flow      Pain Score 6     Pain Loc      Pain Edu?      Excl. in [place]?    No data found.  Updated Vital Signs BP (!) 185/96 (BP Location: Left Arm)   Pulse 69   Temp 98.2 F (36.8 C) (Oral)   Resp 18   SpO2 99%   Visual Acuity Right Eye Distance:   Left Eye Distance:   Bilateral Distance:    Right Eye Near:   Left Eye Near:    Bilateral Near:     Physical Exam Vitals signs and nursing note reviewed.  Constitutional:      General: She is not in acute distress.    Appearance: She is well-developed. She is obese. She is not ill-appearing, toxic-appearing or diaphoretic.  HENT:     Head: Normocephalic and atraumatic.  Neck:     Musculoskeletal: Normal range of motion.  Cardiovascular:     Rate and Rhythm: Normal rate and regular rhythm.  Pulmonary:     Effort: Pulmonary effort is normal.     Breath sounds: Normal breath sounds.  Abdominal:     General: Bowel sounds are normal.     Palpations: Abdomen is soft.     Tenderness: There is no abdominal tenderness. There is no right CVA tenderness or left CVA tenderness.     Comments: Obese abdomen, soft, non-tender.  Musculoskeletal: Normal range of motion.  Skin:    General: Skin is warm and dry.  Neurological:     Mental Status: She is alert and oriented to person, place, and time.  Psychiatric:         Behavior: Behavior normal.  UC Treatments / Results  Labs (all labs ordered are listed, but only abnormal results are displayed) Labs Reviewed - No data to display  EKG   Radiology No results found.  Procedures Procedures (including critical care time)  Medications Ordered in UC Medications - No data to display  Initial Impression / Assessment and Plan / UC Course  I have reviewed the triage vital signs and the nursing notes.  Pertinent labs & imaging results that were available during my care of the patient were reviewed by me and considered in my medical decision making (see chart for details).     Benign abdominal exam. Suspect gastritis Will start pt on omeprazole and zofran AVS provided Pt understanding and agreeable with tx plan.  Final Clinical Impressions(s) / UC Diagnoses   Final diagnoses:  LUQ abdominal pain  Gastroesophageal reflux disease, esophagitis presence not specified  Nausea without vomiting     Discharge Instructions      Please follow up with GYN and primary care as previously scheduled.  Call 911 or go to the hospital if symptoms worsen- worsening pain, blood in vomit, dark or black stool, fever, or other new concerning symptoms develop.     ED Prescriptions    Medication Sig Dispense Auth. Provider   omeprazole (PRILOSEC) 20 MG capsule Take 1 capsule (20 mg total) by mouth daily. 30 capsule Gerarda Fraction, Tivon Lemoine O, PA-C   ondansetron (ZOFRAN) 4 MG tablet Take 1 tablet (4 mg total) by mouth every 6 (six) hours. 12 tablet Noe Gens, PA-C     Controlled Substance Prescriptions [place] Controlled Substance Registry consulted? Not Applicable   Tyrell Antonio 07/11/19 1217

## 2019-07-11 NOTE — Telephone Encounter (Signed)
Per Dr.Dove I called Gyn/Onc to schedule referral appointment for endometrial cancer. Received appointment for 9/1 at 3:00pm with Dr. Alycia Rossetti.  I called [facility] mobile number and left message I am calling with referral appointment as Dr. Hulan Fray informed you- it is for 9/1 at 3:00pm - please call our office for more details. I called her home number and it was busy.  Jillene Wehrenberg,RN

## 2019-07-11 NOTE — Telephone Encounter (Signed)
Dr. Hulan Fray office called and scheduled an appt for new referral

## 2019-07-14 NOTE — Telephone Encounter (Signed)
I called Hannah Decker and she confirmed she has gotten her appointment for tomorrow. I gave her further instructions and she voices understanding.  Linda,RN

## 2019-07-15 ENCOUNTER — Encounter: Payer: Self-pay | Admitting: Gynecologic Oncology

## 2019-07-15 ENCOUNTER — Other Ambulatory Visit: Payer: Self-pay

## 2019-07-15 ENCOUNTER — Inpatient Hospital Stay: Payer: Self-pay | Attending: Gynecologic Oncology | Admitting: Gynecologic Oncology

## 2019-07-15 VITALS — BP 158/89 | HR 74 | Temp 98.3°F | Resp 18 | Ht 66.5 in | Wt 232.4 lb

## 2019-07-15 DIAGNOSIS — I10 Essential (primary) hypertension: Secondary | ICD-10-CM | POA: Insufficient documentation

## 2019-07-15 DIAGNOSIS — C541 Malignant neoplasm of endometrium: Secondary | ICD-10-CM | POA: Insufficient documentation

## 2019-07-15 MED ORDER — SENNOSIDES-DOCUSATE SODIUM 8.6-50 MG PO TABS: 2.0000 | ORAL_TABLET | Freq: Every day | ORAL | 0 refills | Status: DC

## 2019-07-15 MED ORDER — TRAMADOL HCL 50 MG PO TABS: 50.0000 mg | ORAL_TABLET | Freq: Four times a day (QID) | ORAL | 0 refills | Status: DC | PRN

## 2019-07-15 NOTE — H&P (View-Only) (Signed)
Consult Note: Gyn-Onc  Hannah Decker 63 y.o. female  CC:  Chief Complaint  Patient presents with  . New Patient (Initial Visit)    HPI: Patient is seen today in consultation at the request of Dr. Hulan Fray. Patient is a 63 year old para 4 who had an ultrasound done in February 2000 secondary to left lower quadrant pain and it showed an endometrium of 7.7 mm as well as fibroids.  She then re-presented with a 1 year history of postmenopausal bleeding.  Therefore, an endometrial biopsy was performed August 21 with the results as below.  Endometrial biopsy 07/04/19: Diagnosis Endometrium, biopsy - ENDOMETRIOID CARCINOMA - SEE COMMENT Microscopic Comment Based on the biopsy the carcinoma appears FIGO grade 1.   Patient states that she has been menopausal since her late 10s.  She is never taken any hormone replacement therapy.  She states that about 2 years ago she started having some spotting and is progressively gotten heavier up to having some fairly large clots.  Now it is lighter since her biopsy.  She denies any cramping associated with the bleeding.  However, she states that for the past 3 to 4 weeks she is had some left-sided pain.  She thinks that it might of started after she picked up her mom who passed out from dehydration and they had to call EMS.  She has not tried any pain medication, heating pad etc. for this.  She states that her children told her she might of pulled a muscle.  She experiences this pain when she drinks water or swallows her saliva.  She is not really had any health care in the past 3 to 4 years.  She states she is never had a colonoscopy and her last mammogram was more than 4 years ago.  She does not have a primary care physician.  She does experience some nausea in the morning and it gets better after she eats.  She is very confused about allergies versus side effects.  She states that Advil causes her to get constipated but ibuprofen causes her to itch.  When I discussed  that Advil and ibuprofen are essentially same medication she did not really know what to say and thinks that "it might be in my head".  She also states that she might be allergic to Tylenol but similarly could not really quantify what type of reaction she has.  Review of Systems: Constitutional: Denies fever, unintentional weight loss or weight gain. Skin: No rash Cardiovascular: No chest pain, shortness of breath, or edema  Pulmonary: No cough Gastro Intestinal: Reporting intermittent left-sided abdominal soreness.  No nausea, vomiting, constipation, or diarrhea reported.  Genitourinary: No frequency, urgency, or dysuria.  + vaginal bleeding and discharge.  Musculoskeletal: No joint pain.  Psychology: No complaints  Current Meds:  Outpatient Encounter Medications as of 07/15/2019  Medication Sig  . ASPIRIN EC PO Take 81 mg by mouth every 6 (six) hours as needed.  Marland Kitchen omeprazole (PRILOSEC) 20 MG capsule Take 1 capsule (20 mg total) by mouth daily.  . ondansetron (ZOFRAN) 4 MG tablet Take 1 tablet (4 mg total) by mouth every 6 (six) hours.  . fluticasone (FLONASE) 50 MCG/ACT nasal spray Place 2 sprays into the nose daily.  Marland Kitchen loratadine (CLARITIN) 10 MG tablet Take 1 tablet (10 mg total) by mouth daily.  . [DISCONTINUED] cyclobenzaprine (FLEXERIL) 5 MG tablet Take 5 mg by mouth every 8 (eight) hours as needed. for pain or spasm   . [DISCONTINUED] diclofenac (VOLTAREN)  75 MG EC tablet Take 75 mg by mouth 2 (two) times daily. for pain   . [DISCONTINUED] metoprolol (LOPRESSOR) 50 MG tablet Take 1 tablet (50 mg total) by mouth 2 (two) times daily. (Patient not taking: Reported on 07/04/2019)  . [DISCONTINUED] mupirocin nasal ointment (BACTROBAN) 2 % Use 1/2 of single use tube in each nostril twice daily for five (5) days. After application, press sides of nose together, gently massage. (Patient not taking: Reported on 07/04/2019)  . [DISCONTINUED] traMADol (ULTRAM) 50 MG tablet Take 50 mg by mouth. every  4 to 6 hours as needed for pain    No facility-administered encounter medications on file as of 07/15/2019.     Allergy:  Allergies  Allergen Reactions  . Penicillins Hives  . Ace Inhibitors Other (See Comments)    REACTION: cough  . Hydrochlorothiazide     REACTION: ? eyes turn yellow    Social Hx:   Social History   Socioeconomic History  . Marital status: Married    Spouse name: Not on file  . Number of children: Not on file  . Years of education: Not on file  . Highest education level: Not on file  Occupational History  . Not on file  Social Needs  . Financial resource strain: Not on file  . Food insecurity    Worry: Not on file    Inability: Not on file  . Transportation needs    Medical: Not on file    Non-medical: Not on file  Tobacco Use  . Smoking status: Never Smoker  . Smokeless tobacco: Never Used  Substance and Sexual Activity  . Alcohol use: No  . Drug use: No  . Sexual activity: Not on file  Lifestyle  . Physical activity    Days per week: Not on file    Minutes per session: Not on file  . Stress: Not on file  Relationships  . Social Herbalist on phone: Not on file    Gets together: Not on file    Attends religious service: Not on file    Active member of club or organization: Not on file    Attends meetings of clubs or organizations: Not on file    Relationship status: Not on file  . Intimate partner violence    Fear of current or ex partner: Not on file    Emotionally abused: Not on file    Physically abused: Not on file    Forced sexual activity: Not on file  Other Topics Concern  . Not on file  Social History Narrative  . Not on file    Past Surgical Hx:  Past Surgical History:  Procedure Laterality Date  . HERNIA REPAIR    . TUBAL LIGATION      Past Medical Hx:  Past Medical History:  Diagnosis Date  . Hypertension     Oncology Hx:  Oncology History  Endometrial ca [facility])  07/04/2019 Initial Diagnosis    Endometrial ca [facility])     Family Hx:  Family History  Problem Relation Age of Onset  . Hypertension Mother   . Diabetes Father   . Hypertension Father   . Lung cancer Cousin     Vitals:  Blood pressure (!) 158/89, pulse 74, temperature 98.3 F (36.8 C), temperature source Temporal, resp. rate 18, height 5' 6.5" (1.689 m), weight 232 lb 6 oz (105.4 kg), SpO2 100 %.  Physical Exam: Well-nourished well-developed female who appears stated age in no  acute distress.  Neck: Supple, no lymphadenopathy, no thyromegaly.  Abdomen: Obese soft, nontender, nondistended.  No palpable masses.  No hepatosplenomegaly.  Well-healed infraumbilical incision.  Groins: No lymphadenopathy.  Extremities: No edema.  Pelvic: External genitalia within normal limits.  She has significant vaginal wall laxity.  The cervix is multiparous.  There is bloody flow.  There are no visible lesions on the cervix.  Bimanual examination reveals uterus to be normal size shape and consistency.  There are no adnexal masses.  Assessment/Plan: 63 year old with a clinical stage I grade 1 endometrioid adenocarcinoma. A detailed discussion was held with the patient with regard to to her endometrial cancer diagnosis. We discussed the standard management options for uterine cancer which includes surgery followed possibly by adjuvant therapy depending on the results of surgery. The options for surgical management include a hysterectomy and removal of the tubes and ovaries with sentinel lymph node dissection and possible more extensive removal of pelvic and para-aortic lymph nodes if there is no successful mapping or obvious lymphadenopathy is noted..  A minimally invasive approach including a robotic hysterectomy has  benefits including shorter hospital stay, recovery time and better wound healing. The alternative approach is an open hysterectomy. The patient has been counseled about these surgical options and the risks of surgery in  general including infection, bleeding, damage to surrounding structures (including bowel, bladder, ureters, nerves or vessels), and the postoperative risks of PE/ DVT, and lymphedema. I reviewed robotic hysterectomy including possible need for conversion to open laparotomy.   After counseling and consideration of her options, she desires to proceed with robotic hysterectomy bilateral salpingo-oophorectomy and sentinel lymph node dissection.   She was given the opportunity to ask questions, which were answered to her satisfaction, and she is agreement with the above mentioned plan of care.  We spent quite some time going over what might be allergies versus what might be some side effects of medication.  It does not sound like she has an ibuprofen allergy as she listed constipation as a side effect.  She will try taking some Tylenol for her back pain as she is not really sure if she can take Tylenol or not.  She cannot recall what type of reaction she might of had but she states she is "allergic".  She then admits that she thinks she is allergic to a lot of medications that she might not actually be allergic to.  We discussed with her that it is important to have an accurate idea of what your allergies are.  I discussed with her that once she gets over the surgery, that we would recommend that she identifies a primary care physician that is able to get her set up for her routine health maintenance need includes including colonoscopy and mammogram.  I am not sure what the etiology of her left-sided abdominal pain is.  The fact that she only feels it when she drinks water or swallows her saliva is a little unusual.  It sounds like it could be a pulled muscle as temporally it was related to when she lifted her mom up prior to EMS arriving to the house.  She will try heating pad and some Tylenol to see if this helps with her discomfort.  We have encouraged her to stop taking her aspirin.  She is tentatively  scheduled for surgery on September 15 with Dr. Everitt Amber.  We appreciate the opportunity to part in the care of this very pleasant patient.  Nancy Marus A., MD 07/15/2019,  3:51 PM

## 2019-07-15 NOTE — Patient Instructions (Addendum)
Preparing for your Surgery  Plan for surgery on July 29, 2019 with Dr. Everitt Amber at [place] will be scheduled for a robotic assisted total laparoscopic hysterectomy, bilateral salpingo-oophorectomy, sentinel lymph node biopsy.   Pre-operative Testing -You will receive a phone call from presurgical testing at [facility] if you have not received a call already to arrange for a pre-operative testing appointment before your surgery.  This appointment normally occurs one to two weeks before your scheduled surgery.   -Bring your insurance card, copy of an advanced directive if applicable, medication list  -At that visit, you will be asked to sign a consent for a possible blood transfusion in case a transfusion becomes necessary during surgery.  The need for a blood transfusion is rare but having consent is a necessary part of your care.     -You should not be taking blood thinners or aspirin at least ten days prior to surgery unless instructed by your surgeon. STOP YOUR ASPIRIN NOW.  -Do not take supplements such as fish oil (omega 3), red yeast rice, tumeric before your surgery.  Day Before Surgery at [place] will be asked to take in a light diet the day before surgery.  Avoid carbonated beverages.  You will be advised to have nothing to eat or drink after midnight the evening before.    Eat a light diet the day before surgery.  Examples including soups, broths, toast, yogurt, mashed potatoes.  Things to avoid include carbonated beverages (fizzy beverages), raw fruits and raw vegetables, or beans.   If your bowels are filled with gas, your surgeon will have difficulty visualizing your pelvic organs which increases your surgical risks.  Your role in recovery Your role is to become active as soon as directed by your doctor, while still giving yourself time to heal.  Rest when you feel tired. You will be asked to do the following in order to speed your recovery:   - Cough and breathe deeply. This helps toclear and expand your lungs and can prevent pneumonia. You may be given a spirometer to practice deep breathing. A staff member will show you how to use the spirometer. - Do mild physical activity. Walking or moving your legs help your circulation and body functions return to normal. A staff member will help you when you try to walk and will provide you with simple exercises. Do not try to get up or walk alone the first time. - Actively manage your pain. Managing your pain lets you move in comfort. We will ask you to rate your pain on a scale of zero to 10. It is your responsibility to tell your doctor or nurse where and how much you hurt so your pain can be treated.  Special Considerations -If you are diabetic, you may be placed on insulin after surgery to have closer control over your blood sugars to promote healing and recovery.  This does not mean that you will be discharged on insulin.  If applicable, your oral antidiabetics will be resumed when you are tolerating a solid diet.  -Your final pathology results from surgery should be available around one week after surgery and the results will be relayed to you when available.  -Dr. Lahoma Crocker is the surgeon that assists your GYN Oncologist with surgery.  If you end up staying the night, the next day after your surgery you will either see Dr. Denman George or Dr. Lahoma Crocker.  -FMLA forms can be faxed to  [phone] and please allow 5-7 business days for completion.  Pain Management After Surgery -You have been prescribed your pain medication and bowel regimen medications before surgery so that you can have these available when you are discharged from the hospital. The pain medication is for use ONLY AFTER surgery and a new prescription will not be given.   -Make sure that you have Tylenol and Ibuprofen at home to use on a regular basis after surgery for pain control. We recommend alternating the  medications every hour to six hours since they work differently and are processed in the body differently for pain relief.  -Review the attached handout on narcotic use and their risks and side effects.   Bowel Regimen -You have been prescribed Sennakot-S to take nightly to prevent constipation especially if you are taking the narcotic pain medication intermittently.  It is important to prevent constipation and drink adequate amounts of liquids.   Blood Transfusion Information WHAT IS A BLOOD TRANSFUSION? A transfusion is the replacement of blood or some of its parts. Blood is made up of multiple cells which provide different functions.  Red blood cells carry oxygen and are used for blood loss replacement.  White blood cells fight against infection.  Platelets control bleeding.  Plasma helps clot blood.  Other blood products are available for specialized needs, such as hemophilia or other clotting disorders. BEFORE THE TRANSFUSION  Who gives blood for transfusions?   You may be able to donate blood to be used at a later date on yourself (autologous donation).  Relatives can be asked to donate blood. This is generally not any safer than if you have received blood from a stranger. The same precautions are taken to ensure safety when a relative's blood is donated.  Healthy volunteers who are fully evaluated to make sure their blood is safe. This is blood bank blood. Transfusion therapy is the safest it has ever been in the practice of medicine. Before blood is taken from a donor, a complete history is taken to make sure that person has no history of diseases nor engages in risky social behavior (examples are intravenous drug use or sexual activity with multiple partners). The donor's travel history is screened to minimize risk of transmitting infections, such as malaria. The donated blood is tested for signs of infectious diseases, such as HIV and hepatitis. The blood is then tested to be  sure it is compatible with you in order to minimize the chance of a transfusion reaction. If you or a relative donates blood, this is often done in anticipation of surgery and is not appropriate for emergency situations. It takes many days to process the donated blood. RISKS AND COMPLICATIONS Although transfusion therapy is very safe and saves many lives, the main dangers of transfusion include:   Getting an infectious disease.  Developing a transfusion reaction. This is an allergic reaction to something in the blood you were given. Every precaution is taken to prevent this. The decision to have a blood transfusion has been considered carefully by your caregiver before blood is given. Blood is not given unless the benefits outweigh the risks.  AFTER SURGERY CONSIDERATION  07/15/2019  Return to work: 4-6 weeks if applicable  Activity: 1. Be up and out of the bed during the day.  Take a nap if needed.  You may walk up steps but be careful and use the hand rail.  Stair climbing will tire you more than you think, you may need to stop part  way and rest.   2. No lifting or straining for 6 weeks.  3. No driving for 1 week(s).  Do not drive if you are taking narcotic pain medicine.  4. Shower daily.  Use soap and water on your incision and pat dry; don't rub.  No tub baths until cleared by your surgeon.   5. No sexual activity and nothing in the vagina for 8 weeks.  6. You may experience a small amount of clear drainage from your incisions, which is normal.  If the drainage persists or increases, please call the office.  7. You may experience vaginal spotting after surgery or around the 6-8 week mark from surgery when the stitches at the top of the vagina begin to dissolve.  The spotting is normal but if you experience heavy bleeding, call our office.  8. Take Tylenol or ibuprofen first for pain and only use NARCOTIC PAIN MEDS for severe pain not relieved by the Tylenol or Ibuprofen.  Monitor  your Tylenol intake to a max of 4,000 mg a day.  Diet: 1. Low sodium Heart Healthy Diet is recommended.  2. It is safe to use a laxative, such as Miralax or Colace, if you have difficulty moving your bowels. You can take Sennakot at bedtime every evening to keep bowel movements regular and to prevent constipation.    Wound Care: 1. Keep clean and dry.  Shower daily.  Reasons to call the Doctor:  Fever - Oral temperature greater than 100.4 degrees Fahrenheit  Foul-smelling vaginal discharge  Difficulty urinating  Nausea and vomiting  Increased pain at the site of the incision that is unrelieved with pain medicine.  Difficulty breathing with or without chest pain  New calf pain especially if only on one side  Sudden, continuing increased vaginal bleeding with or without clots.   Contacts: For questions or concerns you should contact:  Dr. Everitt Amber at [phone]  Joylene John, NP at [phone]  After Hours: call [phone] and have the GYN Oncologist paged/contacted

## 2019-07-15 NOTE — Progress Notes (Signed)
Consult Note: Gyn-Onc  Hannah Decker 63 y.o. female  CC:  Chief Complaint  Patient presents with  . New Patient (Initial Visit)    HPI: Patient is seen today in consultation at the request of Dr. Hulan Fray. Patient is a 63 year old para 4 who had an ultrasound done in February 2000 secondary to left lower quadrant pain and it showed an endometrium of 7.7 mm as well as fibroids.  She then re-presented with a 1 year history of postmenopausal bleeding.  Therefore, an endometrial biopsy was performed August 21 with the results as below.  Endometrial biopsy 07/04/19: Diagnosis Endometrium, biopsy - ENDOMETRIOID CARCINOMA - SEE COMMENT Microscopic Comment Based on the biopsy the carcinoma appears FIGO grade 1.   Patient states that she has been menopausal since her late 73s.  She is never taken any hormone replacement therapy.  She states that about 2 years ago she started having some spotting and is progressively gotten heavier up to having some fairly large clots.  Now it is lighter since her biopsy.  She denies any cramping associated with the bleeding.  However, she states that for the past 3 to 4 weeks she is had some left-sided pain.  She thinks that it might of started after she picked up her mom who passed out from dehydration and they had to call EMS.  She has not tried any pain medication, heating pad etc. for this.  She states that her children told her she might of pulled a muscle.  She experiences this pain when she drinks water or swallows her saliva.  She is not really had any health care in the past 3 to 4 years.  She states she is never had a colonoscopy and her last mammogram was more than 4 years ago.  She does not have a primary care physician.  She does experience some nausea in the morning and it gets better after she eats.  She is very confused about allergies versus side effects.  She states that Advil causes her to get constipated but ibuprofen causes her to itch.  When I discussed  that Advil and ibuprofen are essentially same medication she did not really know what to say and thinks that "it might be in my head".  She also states that she might be allergic to Tylenol but similarly could not really quantify what type of reaction she has.  Review of Systems: Constitutional: Denies fever, unintentional weight loss or weight gain. Skin: No rash Cardiovascular: No chest pain, shortness of breath, or edema  Pulmonary: No cough Gastro Intestinal: Reporting intermittent left-sided abdominal soreness.  No nausea, vomiting, constipation, or diarrhea reported.  Genitourinary: No frequency, urgency, or dysuria.  + vaginal bleeding and discharge.  Musculoskeletal: No joint pain.  Psychology: No complaints  Current Meds:  Outpatient Encounter Medications as of 07/15/2019  Medication Sig  . ASPIRIN EC PO Take 81 mg by mouth every 6 (six) hours as needed.  Marland Kitchen omeprazole (PRILOSEC) 20 MG capsule Take 1 capsule (20 mg total) by mouth daily.  . ondansetron (ZOFRAN) 4 MG tablet Take 1 tablet (4 mg total) by mouth every 6 (six) hours.  . fluticasone (FLONASE) 50 MCG/ACT nasal spray Place 2 sprays into the nose daily.  Marland Kitchen loratadine (CLARITIN) 10 MG tablet Take 1 tablet (10 mg total) by mouth daily.  . [DISCONTINUED] cyclobenzaprine (FLEXERIL) 5 MG tablet Take 5 mg by mouth every 8 (eight) hours as needed. for pain or spasm   . [DISCONTINUED] diclofenac (VOLTAREN)  75 MG EC tablet Take 75 mg by mouth 2 (two) times daily. for pain   . [DISCONTINUED] metoprolol (LOPRESSOR) 50 MG tablet Take 1 tablet (50 mg total) by mouth 2 (two) times daily. (Patient not taking: Reported on 07/04/2019)  . [DISCONTINUED] mupirocin nasal ointment (BACTROBAN) 2 % Use 1/2 of single use tube in each nostril twice daily for five (5) days. After application, press sides of nose together, gently massage. (Patient not taking: Reported on 07/04/2019)  . [DISCONTINUED] traMADol (ULTRAM) 50 MG tablet Take 50 mg by mouth. every  4 to 6 hours as needed for pain    No facility-administered encounter medications on file as of 07/15/2019.     Allergy:  Allergies  Allergen Reactions  . Penicillins Hives  . Ace Inhibitors Other (See Comments)    REACTION: cough  . Hydrochlorothiazide     REACTION: ? eyes turn yellow    Social Hx:   Social History   Socioeconomic History  . Marital status: Married    Spouse name: Not on file  . Number of children: Not on file  . Years of education: Not on file  . Highest education level: Not on file  Occupational History  . Not on file  Social Needs  . Financial resource strain: Not on file  . Food insecurity    Worry: Not on file    Inability: Not on file  . Transportation needs    Medical: Not on file    Non-medical: Not on file  Tobacco Use  . Smoking status: Never Smoker  . Smokeless tobacco: Never Used  Substance and Sexual Activity  . Alcohol use: No  . Drug use: No  . Sexual activity: Not on file  Lifestyle  . Physical activity    Days per week: Not on file    Minutes per session: Not on file  . Stress: Not on file  Relationships  . Social Herbalist on phone: Not on file    Gets together: Not on file    Attends religious service: Not on file    Active member of club or organization: Not on file    Attends meetings of clubs or organizations: Not on file    Relationship status: Not on file  . Intimate partner violence    Fear of current or ex partner: Not on file    Emotionally abused: Not on file    Physically abused: Not on file    Forced sexual activity: Not on file  Other Topics Concern  . Not on file  Social History Narrative  . Not on file    Past Surgical Hx:  Past Surgical History:  Procedure Laterality Date  . HERNIA REPAIR    . TUBAL LIGATION      Past Medical Hx:  Past Medical History:  Diagnosis Date  . Hypertension     Oncology Hx:  Oncology History  Endometrial ca [facility])  07/04/2019 Initial Diagnosis    Endometrial ca [facility])     Family Hx:  Family History  Problem Relation Age of Onset  . Hypertension Mother   . Diabetes Father   . Hypertension Father   . Lung cancer Cousin     Vitals:  Blood pressure (!) 158/89, pulse 74, temperature 98.3 F (36.8 C), temperature source Temporal, resp. rate 18, height 5' 6.5" (1.689 m), weight 232 lb 6 oz (105.4 kg), SpO2 100 %.  Physical Exam: Well-nourished well-developed female who appears stated age in no  acute distress.  Neck: Supple, no lymphadenopathy, no thyromegaly.  Abdomen: Obese soft, nontender, nondistended.  No palpable masses.  No hepatosplenomegaly.  Well-healed infraumbilical incision.  Groins: No lymphadenopathy.  Extremities: No edema.  Pelvic: External genitalia within normal limits.  She has significant vaginal wall laxity.  The cervix is multiparous.  There is bloody flow.  There are no visible lesions on the cervix.  Bimanual examination reveals uterus to be normal size shape and consistency.  There are no adnexal masses.  Assessment/Plan: 63 year old with a clinical stage I grade 1 endometrioid adenocarcinoma. A detailed discussion was held with the patient with regard to to her endometrial cancer diagnosis. We discussed the standard management options for uterine cancer which includes surgery followed possibly by adjuvant therapy depending on the results of surgery. The options for surgical management include a hysterectomy and removal of the tubes and ovaries with sentinel lymph node dissection and possible more extensive removal of pelvic and para-aortic lymph nodes if there is no successful mapping or obvious lymphadenopathy is noted..  A minimally invasive approach including a robotic hysterectomy has  benefits including shorter hospital stay, recovery time and better wound healing. The alternative approach is an open hysterectomy. The patient has been counseled about these surgical options and the risks of surgery in  general including infection, bleeding, damage to surrounding structures (including bowel, bladder, ureters, nerves or vessels), and the postoperative risks of PE/ DVT, and lymphedema. I reviewed robotic hysterectomy including possible need for conversion to open laparotomy.   After counseling and consideration of her options, she desires to proceed with robotic hysterectomy bilateral salpingo-oophorectomy and sentinel lymph node dissection.   She was given the opportunity to ask questions, which were answered to her satisfaction, and she is agreement with the above mentioned plan of care.  We spent quite some time going over what might be allergies versus what might be some side effects of medication.  It does not sound like she has an ibuprofen allergy as she listed constipation as a side effect.  She will try taking some Tylenol for her back pain as she is not really sure if she can take Tylenol or not.  She cannot recall what type of reaction she might of had but she states she is "allergic".  She then admits that she thinks she is allergic to a lot of medications that she might not actually be allergic to.  We discussed with her that it is important to have an accurate idea of what your allergies are.  I discussed with her that once she gets over the surgery, that we would recommend that she identifies a primary care physician that is able to get her set up for her routine health maintenance need includes including colonoscopy and mammogram.  I am not sure what the etiology of her left-sided abdominal pain is.  The fact that she only feels it when she drinks water or swallows her saliva is a little unusual.  It sounds like it could be a pulled muscle as temporally it was related to when she lifted her mom up prior to EMS arriving to the house.  She will try heating pad and some Tylenol to see if this helps with her discomfort.  We have encouraged her to stop taking her aspirin.  She is tentatively  scheduled for surgery on September 15 with Dr. Everitt Amber.  We appreciate the opportunity to part in the care of this very pleasant patient.  Nancy Marus A., MD 07/15/2019,  3:51 PM

## 2019-07-16 ENCOUNTER — Other Ambulatory Visit: Payer: Self-pay | Admitting: Gynecologic Oncology

## 2019-07-16 DIAGNOSIS — C541 Malignant neoplasm of endometrium: Secondary | ICD-10-CM

## 2019-07-24 NOTE — Patient Instructions (Addendum)
DUE TO COVID-19 ONLY ONE VISITOR IS ALLOWED TO COME WITH YOU AND STAY IN THE WAITING ROOM ONLY DURING PRE OP AND PROCEDURE DAY OF SURGERY. THE 1 VISITOR MAY VISIT WITH YOU AFTER SURGERY IN YOUR PRIVATE ROOM DURING VISITING HOURS ONLY!  YOU NEED TO HAVE A COVID 19 TEST ON__9/11_____ @__1 :10 PM_____, THIS TEST MUST BE DONE BEFORE SURGERY, COME  [address], [place] , [address].  ([place])  ONCE YOUR COVID TEST IS COMPLETED, PLEASE BEGIN THE QUARANTINE INSTRUCTIONS AS OUTLINED IN YOUR HANDOUT.                Hannah Decker   Your procedure is scheduled on: 07/29/19   Report to [facility] Main  Entrance   Report to admitting at  6:00 AM     Call this number if you have problems the morning of surgery [place], NO CHEWING GUM [place].                  Eat a light diet the day before surgery. Avoid foods that are gas producing.   Do not eat food After Midnight.   YOU MAY HAVE CLEAR LIQUIDS FROM MIDNIGHT UNTIL 4:00 AM.     CLEAR LIQUID DIET   Foods Allowed                                                                     Foods Excluded  Coffee and tea, regular and decaf                             liquids that you cannot  Plain Jell-O any favor except red or purple                                           see through such as: Fruit ices (not with fruit pulp)                                     milk, soups, orange juice  Iced Popsicles                                    All solid food Carbonated beverages, regular and diet                                    Cranberry, grape and apple juices Sports drinks like Gatorade Lightly seasoned clear broth or consume(fat free) Sugar, honey syrup  _____________________________________________________________________    Take these medicines the morning of surgery with A SIP OF WATER: Prilosec                         You may not have any metal on your body including  hair pins and              piercings             Do not wear jewelry, make-up, lotions, powders or perfumes, deodorant             Do not wear nail polish.  Do not shave  48 hours prior to surgery.           Do not bring valuables to the hospital. [place].  Contacts, dentures or bridgework may not be worn into surgery.  .     Patients discharged the day of surgery will not be allowed to drive home.  IF YOU ARE HAVING SURGERY AND GOING HOME THE SAME DAY, YOU MUST HAVE AN ADULT TO DRIVE YOU HOME AND BE WITH YOU FOR 24 HOURS . YOU MAY GO HOME BY TAXI OR UBER OR ORTHERWISE, BUT AN ADULT MUST ACCOMPANY YOU HOME AND STAY WITH YOU FOR 24 HOURS.  Name and phone number of your driver:  Special Instructions: N/A              Please read over the following fact sheets you were given: _____________________________________________________________________             [facility] - Preparing for Surgery Before surgery, you can play an important role.   Because skin is not sterile, your skin needs to be as free of germs as possible.   You can reduce the number of germs on your skin by washing with CHG (chlorahexidine gluconate) soap before surgery.   CHG is an antiseptic cleaner which kills germs and bonds with the skin to continue killing germs even after washing. Please DO NOT use if you have an allergy to CHG or antibacterial soaps .  If your skin becomes reddened/irritated stop using the CHG and inform your nurse when you arrive at Short Stay. Do not shave (including legs and underarms) for at least 48 hours prior to the first CHG shower. Please follow these instructions carefully:  1.  Shower with CHG Soap the night before surgery and the  morning of Surgery.  2.  If you choose to wash your hair, wash your hair first as usual with your  normal  shampoo.  3.  After you shampoo, rinse your  hair and body thoroughly to remove the  shampoo.                                        4.  Use CHG as you would any other liquid soap.  You can apply chg directly  to the skin and wash                       Gently with a scrungie or clean washcloth.  5.  Apply the CHG Soap to your body ONLY FROM THE NECK DOWN.   Do not use on face/ open                           Wound or open sores. Avoid contact with eyes, ears mouth and genitals (private parts).  Wash face,  Genitals (private parts) with your normal soap.             6.  Wash thoroughly, paying special attention to the area where your surgery  will be performed.  7.  Thoroughly rinse your body with warm water from the neck down.  8.  DO NOT shower/wash with your normal soap after using and rinsing off  the CHG Soap.             9.  Pat yourself dry with a clean towel.            10.  Wear clean pajamas.            11.  Place clean sheets on your bed the night of your first shower and do not  sleep with pets. Day of Surgery : Do not apply any lotions/deodorants the morning of surgery.  Please wear clean clothes to the hospital/surgery center.  FAILURE TO FOLLOW THESE INSTRUCTIONS MAY RESULT IN THE CANCELLATION OF YOUR SURGERY PATIENT SIGNATURE_________________________________  NURSE SIGNATURE__________________________________  ________________________________________________________________________   Hannah Decker  An incentive spirometer is a tool that can help keep your lungs clear and active. This tool measures how well you are filling your lungs with each breath. Taking long deep breaths may help reverse or decrease the chance of developing breathing (pulmonary) problems (especially infection) following:  A long period of time when you are unable to move or be active. BEFORE THE PROCEDURE   If the spirometer includes an indicator to show your best effort, your nurse or respiratory therapist will set it  to a desired goal.  If possible, sit up straight or lean slightly forward. Try not to slouch.  Hold the incentive spirometer in an upright position. INSTRUCTIONS FOR USE  1. Sit on the edge of your bed if possible, or sit up as far as you can in bed or on a chair. 2. Hold the incentive spirometer in an upright position. 3. Breathe out normally. 4. Place the mouthpiece in your mouth and seal your lips tightly around it. 5. Breathe in slowly and as deeply as possible, raising the piston or the ball toward the top of the column. 6. Hold your breath for 3-5 seconds or for as long as possible. Allow the piston or ball to fall to the bottom of the column. 7. Remove the mouthpiece from your mouth and breathe out normally. 8. Rest for a few seconds and repeat Steps 1 through 7 at least 10 times every 1-2 hours when you are awake. Take your time and take a few normal breaths between deep breaths. 9. The spirometer may include an indicator to show your best effort. Use the indicator as a goal to work toward during each repetition. 10. After each set of 10 deep breaths, practice coughing to be sure your lungs are clear. If you have an incision (the cut made at the time of surgery), support your incision when coughing by placing a pillow or rolled up towels firmly against it. Once you are able to get out of bed, walk around indoors and cough well. You may stop using the incentive spirometer when instructed by your caregiver.  RISKS AND COMPLICATIONS  Take your time so you do not get dizzy or light-headed.  If you are in pain, you may need to take or ask for pain medication before doing incentive spirometry. It is harder to take a deep breath if you are having pain. AFTER USE  Rest and breathe slowly and easily.  It can be helpful to keep track of a log of your progress. Your caregiver can provide you with a simple table to help with this. If you are using the spirometer at home, follow these  instructions: [place] IF:   You are having difficultly using the spirometer.  You have trouble using the spirometer as often as instructed.  Your pain medication is not giving enough relief while using the spirometer.  You develop fever of 100.5 F (38.1 C) or higher. SEEK IMMEDIATE MEDICAL CARE IF:   You cough up bloody sputum that had not been present before.  You develop fever of 102 F (38.9 C) or greater.  You develop worsening pain at or near the incision site. MAKE SURE YOU:   Understand these instructions.  Will watch your condition.  Will get help right away if you are not doing well or get worse. Document Released: 03/12/2007 Document Revised: 01/22/2012 Document Reviewed: 05/13/2007 [facility] Patient Information 2014 [place], [place].   ________________________________________________________________________

## 2019-07-25 ENCOUNTER — Encounter (HOSPITAL_COMMUNITY): Payer: Self-pay

## 2019-07-25 ENCOUNTER — Other Ambulatory Visit: Payer: Self-pay

## 2019-07-25 ENCOUNTER — Other Ambulatory Visit (HOSPITAL_COMMUNITY)
Admission: RE | Admit: 2019-07-25 | Discharge: 2019-07-25 | Disposition: A | Discharge status: Home / Self Care | Payer: HRSA Program | Source: Ambulatory Visit | Attending: Gynecologic Oncology | Admitting: Gynecologic Oncology

## 2019-07-25 ENCOUNTER — Encounter (HOSPITAL_COMMUNITY)
Admission: RE | Admit: 2019-07-25 | Discharge: 2019-07-25 | Disposition: A | Discharge status: Home / Self Care | Payer: Self-pay | Source: Ambulatory Visit | Attending: Gynecologic Oncology | Admitting: Gynecologic Oncology

## 2019-07-25 DIAGNOSIS — Z01812 Encounter for preprocedural laboratory examination: Secondary | ICD-10-CM | POA: Insufficient documentation

## 2019-07-25 DIAGNOSIS — Z20828 Contact with and (suspected) exposure to other viral communicable diseases: Secondary | ICD-10-CM | POA: Insufficient documentation

## 2019-07-25 DIAGNOSIS — R9431 Abnormal electrocardiogram [ECG] [EKG]: Secondary | ICD-10-CM | POA: Insufficient documentation

## 2019-07-25 DIAGNOSIS — Z01810 Encounter for preprocedural cardiovascular examination: Secondary | ICD-10-CM | POA: Insufficient documentation

## 2019-07-25 HISTORY — DX: Family history of other specified conditions: Z84.89

## 2019-07-25 HISTORY — DX: Unspecified osteoarthritis, unspecified site: M19.90

## 2019-07-25 LAB — URINALYSIS, ROUTINE W REFLEX MICROSCOPIC
Bilirubin Urine: NEGATIVE
Glucose, UA: NEGATIVE mg/dL
Hgb urine dipstick: NEGATIVE
Ketones, ur: NEGATIVE mg/dL
Leukocytes,Ua: NEGATIVE
Nitrite: NEGATIVE
Protein, ur: NEGATIVE mg/dL
Specific Gravity, Urine: 1.027 (ref 1.005–1.030)
pH: 5 (ref 5.0–8.0)

## 2019-07-25 LAB — COMPREHENSIVE METABOLIC PANEL
ALT: 16 U/L (ref 0–44)
AST: 16 U/L (ref 15–41)
Albumin: 3.9 g/dL (ref 3.5–5.0)
Alkaline Phosphatase: 81 U/L (ref 38–126)
Anion gap: 7 (ref 5–15)
BUN: 19 mg/dL (ref 8–23)
CO2: 27 mmol/L (ref 22–32)
Calcium: 9.1 mg/dL (ref 8.9–10.3)
Chloride: 109 mmol/L (ref 98–111)
Creatinine, Ser: 1.05 mg/dL — ABNORMAL HIGH (ref 0.44–1.00)
GFR calc Af Amer: >60 mL/min (ref >60)
GFR calc non Af Amer: 56 mL/min — ABNORMAL LOW (ref >60)
Glucose, Bld: 96 mg/dL (ref 70–99)
Potassium: 3.4 mmol/L — ABNORMAL LOW (ref 3.5–5.1)
Sodium: 143 mmol/L (ref 135–145)
Total Bilirubin: 0.6 mg/dL (ref 0.3–1.2)
Total Protein: 8.2 g/dL — ABNORMAL HIGH (ref 6.5–8.1)

## 2019-07-25 LAB — CBC
HCT: 44.8 % (ref 36.0–46.0)
Hemoglobin: 14 g/dL (ref 12.0–15.0)
MCH: 26.2 pg (ref 26.0–34.0)
MCHC: 31.3 g/dL (ref 30.0–36.0)
MCV: 83.7 fL (ref 80.0–100.0)
Platelets: 276 10*3/uL (ref 150–400)
RBC: 5.35 MIL/uL — ABNORMAL HIGH (ref 3.87–5.11)
RDW: 16.2 % — ABNORMAL HIGH (ref 11.5–15.5)
WBC: 6.5 10*3/uL (ref 4.0–10.5)
nRBC: 0 % (ref 0.0–0.2)

## 2019-07-25 NOTE — Progress Notes (Signed)
PCP -  Z. Raul Del NP Cardiologist - none  Chest x-ray - no EKG - 07/25/19 Stress Test - no ECHO - no Cardiac Cath - no  Sleep Study - no CPAP - no  Fasting Blood Sugar - NA Checks Blood Sugar _____ times a day  Blood Thinner Instructions:NAAspirin Instructions: Last Dose:  Anesthesia review:   Patient denies shortness of breath, fever, cough and chest pain at PAT appointment yes  Patient verbalized understanding of instructions that were given to them at the PAT appointment. Patient was also instructed that they will need to review over the PAT instructions again at home before surgery. yes

## 2019-07-26 LAB — NOVEL CORONAVIRUS, NAA (HOSP ORDER, SEND-OUT TO REF LAB; TAT 18-24 HRS): SARS-CoV-2, NAA: NOT DETECTED

## 2019-07-26 LAB — ABO/RH: ABO/RH(D): A POS

## 2019-07-28 NOTE — Anesthesia Preprocedure Evaluation (Addendum)
Anesthesia Evaluation  Patient identified by MRN, date of birth, ID band Patient awake    Reviewed: Allergy & Precautions, H&P , NPO status , Patient's Chart, lab work & pertinent test results  Airway Mallampati: II  TM Distance: >3 FB Neck ROM: Full    Dental no notable dental hx. (+) Edentulous Upper, Dental Advisory Given   Pulmonary neg pulmonary ROS,    Pulmonary exam normal breath sounds clear to auscultation       Cardiovascular Exercise Tolerance: Good hypertension,  Rhythm:Regular Rate:Normal     Neuro/Psych negative neurological ROS  negative psych ROS   GI/Hepatic negative GI ROS, Neg liver ROS,   Endo/Other  Morbid obesity  Renal/GU negative Renal ROS  negative genitourinary   Musculoskeletal  (+) Arthritis , Osteoarthritis,    Abdominal   Peds  Hematology negative hematology ROS (+)   Anesthesia Other Findings   Reproductive/Obstetrics negative OB ROS                            Anesthesia Physical Anesthesia Plan  ASA: III  Anesthesia Plan: General   Post-op Pain Management:    Induction: Intravenous  PONV Risk Score and Plan: 4 or greater and Ondansetron, Dexamethasone, Midazolam and Scopolamine patch - Pre-op  Airway Management Planned: Oral ETT  Additional Equipment:   Intra-op Plan:   Post-operative Plan: Extubation in OR  Informed Consent: I have reviewed the patients History and Physical, chart, labs and discussed the procedure including the risks, benefits and alternatives for the proposed anesthesia with the patient or authorized representative who has indicated his/her understanding and acceptance.     Dental advisory given  Plan Discussed with: CRNA  Anesthesia Plan Comments:         Anesthesia Quick Evaluation

## 2019-07-29 ENCOUNTER — Ambulatory Visit (HOSPITAL_COMMUNITY): Payer: Self-pay | Admitting: Certified Registered"

## 2019-07-29 ENCOUNTER — Ambulatory Visit (HOSPITAL_COMMUNITY)
Admission: RE | Admit: 2019-07-29 | Discharge: 2019-07-29 | Disposition: A | Discharge status: Home / Self Care | Payer: Self-pay | Attending: Gynecologic Oncology | Admitting: Gynecologic Oncology

## 2019-07-29 ENCOUNTER — Other Ambulatory Visit: Payer: Self-pay

## 2019-07-29 ENCOUNTER — Ambulatory Visit (HOSPITAL_COMMUNITY): Payer: Self-pay | Admitting: Physician Assistant

## 2019-07-29 ENCOUNTER — Encounter (HOSPITAL_COMMUNITY)
Admission: RE | Disposition: A | Discharge status: Home / Self Care | Payer: Self-pay | Source: Home / Self Care | Attending: Gynecologic Oncology

## 2019-07-29 ENCOUNTER — Encounter (HOSPITAL_COMMUNITY): Payer: Self-pay | Admitting: *Deleted

## 2019-07-29 DIAGNOSIS — Z6836 Body mass index (BMI) 36.0-36.9, adult: Secondary | ICD-10-CM | POA: Insufficient documentation

## 2019-07-29 DIAGNOSIS — E669 Obesity, unspecified: Secondary | ICD-10-CM | POA: Diagnosis present

## 2019-07-29 DIAGNOSIS — C541 Malignant neoplasm of endometrium: Secondary | ICD-10-CM | POA: Diagnosis present

## 2019-07-29 DIAGNOSIS — I10 Essential (primary) hypertension: Secondary | ICD-10-CM | POA: Insufficient documentation

## 2019-07-29 DIAGNOSIS — M1990 Unspecified osteoarthritis, unspecified site: Secondary | ICD-10-CM | POA: Insufficient documentation

## 2019-07-29 DIAGNOSIS — N800 Endometriosis of uterus: Secondary | ICD-10-CM | POA: Insufficient documentation

## 2019-07-29 DIAGNOSIS — Z888 Allergy status to other drugs, medicaments and biological substances status: Secondary | ICD-10-CM | POA: Insufficient documentation

## 2019-07-29 DIAGNOSIS — D259 Leiomyoma of uterus, unspecified: Secondary | ICD-10-CM | POA: Insufficient documentation

## 2019-07-29 DIAGNOSIS — Z880 Allergy status to penicillin: Secondary | ICD-10-CM | POA: Insufficient documentation

## 2019-07-29 DIAGNOSIS — Z79899 Other long term (current) drug therapy: Secondary | ICD-10-CM | POA: Insufficient documentation

## 2019-07-29 DIAGNOSIS — Z8249 Family history of ischemic heart disease and other diseases of the circulatory system: Secondary | ICD-10-CM | POA: Insufficient documentation

## 2019-07-29 DIAGNOSIS — N950 Postmenopausal bleeding: Secondary | ICD-10-CM | POA: Insufficient documentation

## 2019-07-29 HISTORY — PX: ROBOTIC ASSISTED TOTAL HYSTERECTOMY WITH BILATERAL SALPINGO OOPHERECTOMY: SHX6086

## 2019-07-29 HISTORY — PX: SENTINEL NODE BIOPSY: SHX6608

## 2019-07-29 LAB — TYPE AND SCREEN
ABO/RH(D): A POS
Antibody Screen: NEGATIVE

## 2019-07-29 SURGERY — ROBOTIC ASSISTED TOTAL HYSTERECTOMY WITH BILATERAL SALPINGO OOPHORECTOMY
Anesthesia: General | Laterality: Bilateral

## 2019-07-29 MED ORDER — STERILE WATER FOR INJECTION IJ SOLN
INTRAMUSCULAR | Status: DC | PRN
  Administered 2019-07-29: 9 mL

## 2019-07-29 MED ORDER — BUPIVACAINE HCL (PF) 0.25 % IJ SOLN
INTRAMUSCULAR | Status: DC | PRN
  Administered 2019-07-29: 18 mL

## 2019-07-29 MED ORDER — PROPOFOL 10 MG/ML IV BOLUS
INTRAVENOUS | Status: AC
  Filled 2019-07-29: qty 20

## 2019-07-29 MED ORDER — HYDROMORPHONE HCL 1 MG/ML IJ SOLN: 0.2500 mg | INTRAMUSCULAR | Status: DC | PRN

## 2019-07-29 MED ORDER — DEXAMETHASONE SODIUM PHOSPHATE 10 MG/ML IJ SOLN
INTRAMUSCULAR | Status: AC
  Filled 2019-07-29: qty 1

## 2019-07-29 MED ORDER — CELECOXIB 200 MG PO CAPS
400.0000 mg | ORAL_CAPSULE | ORAL | Status: AC
  Administered 2019-07-29: 07:00:00 400 mg via ORAL
  Filled 2019-07-29: qty 2

## 2019-07-29 MED ORDER — BUPIVACAINE HCL (PF) 0.25 % IJ SOLN
INTRAMUSCULAR | Status: AC
  Filled 2019-07-29: qty 30

## 2019-07-29 MED ORDER — ONDANSETRON HCL 4 MG/2ML IJ SOLN
INTRAMUSCULAR | Status: AC
  Filled 2019-07-29: qty 2

## 2019-07-29 MED ORDER — ROCURONIUM BROMIDE 10 MG/ML (PF) SYRINGE
PREFILLED_SYRINGE | INTRAVENOUS | Status: DC | PRN
  Administered 2019-07-29: 50 mg via INTRAVENOUS
  Administered 2019-07-29: 10 mg via INTRAVENOUS

## 2019-07-29 MED ORDER — ONDANSETRON HCL 4 MG/2ML IJ SOLN
INTRAMUSCULAR | Status: DC | PRN
  Administered 2019-07-29: 4 mg via INTRAVENOUS

## 2019-07-29 MED ORDER — ACETAMINOPHEN 500 MG PO TABS
1000.0000 mg | ORAL_TABLET | ORAL | Status: AC
  Administered 2019-07-29: 07:00:00 1000 mg via ORAL
  Filled 2019-07-29: qty 2

## 2019-07-29 MED ORDER — LIDOCAINE 2% (20 MG/ML) 5 ML SYRINGE
INTRAMUSCULAR | Status: AC
  Filled 2019-07-29: qty 5

## 2019-07-29 MED ORDER — FENTANYL CITRATE (PF) 250 MCG/5ML IJ SOLN
INTRAMUSCULAR | Status: DC | PRN
  Administered 2019-07-29 (×4): 50 ug via INTRAVENOUS

## 2019-07-29 MED ORDER — KETAMINE HCL 10 MG/ML IJ SOLN
INTRAMUSCULAR | Status: DC | PRN
  Administered 2019-07-29: 30 mg via INTRAVENOUS

## 2019-07-29 MED ORDER — ESMOLOL HCL 100 MG/10ML IV SOLN
INTRAVENOUS | Status: AC
  Filled 2019-07-29: qty 10

## 2019-07-29 MED ORDER — INDOCYANINE GREEN 25 MG IV SOLR
INTRAVENOUS | Status: DC | PRN
  Administered 2019-07-29: 2.5 mg

## 2019-07-29 MED ORDER — LIDOCAINE HCL 2 % IJ SOLN
INTRAMUSCULAR | Status: AC
  Filled 2019-07-29: qty 20

## 2019-07-29 MED ORDER — SUCCINYLCHOLINE CHLORIDE 200 MG/10ML IV SOSY
PREFILLED_SYRINGE | INTRAVENOUS | Status: AC
  Filled 2019-07-29: qty 10

## 2019-07-29 MED ORDER — DEXAMETHASONE SODIUM PHOSPHATE 10 MG/ML IJ SOLN
INTRAMUSCULAR | Status: DC | PRN
  Administered 2019-07-29: 6 mg via INTRAVENOUS

## 2019-07-29 MED ORDER — FENTANYL CITRATE (PF) 250 MCG/5ML IJ SOLN
INTRAMUSCULAR | Status: AC
  Filled 2019-07-29: qty 5

## 2019-07-29 MED ORDER — ROCURONIUM BROMIDE 10 MG/ML (PF) SYRINGE
PREFILLED_SYRINGE | INTRAVENOUS | Status: AC
  Filled 2019-07-29: qty 10

## 2019-07-29 MED ORDER — SUGAMMADEX SODIUM 200 MG/2ML IV SOLN
INTRAVENOUS | Status: DC | PRN
  Administered 2019-07-29: 200 mg via INTRAVENOUS

## 2019-07-29 MED ORDER — KETAMINE HCL 10 MG/ML IJ SOLN
INTRAMUSCULAR | Status: AC
  Filled 2019-07-29: qty 1

## 2019-07-29 MED ORDER — SUCCINYLCHOLINE CHLORIDE 200 MG/10ML IV SOSY
PREFILLED_SYRINGE | INTRAVENOUS | Status: DC | PRN
  Administered 2019-07-29: 100 mg via INTRAVENOUS

## 2019-07-29 MED ORDER — SCOPOLAMINE 1 MG/3DAYS TD PT72
1.0000 | MEDICATED_PATCH | TRANSDERMAL | Status: DC
  Administered 2019-07-29: 07:00:00 1.5 mg via TRANSDERMAL
  Filled 2019-07-29: qty 1

## 2019-07-29 MED ORDER — ACETAMINOPHEN 500 MG PO TABS: 1000.0000 mg | ORAL_TABLET | Freq: Once | ORAL | Status: DC

## 2019-07-29 MED ORDER — DEXAMETHASONE SODIUM PHOSPHATE 4 MG/ML IJ SOLN: 4.0000 mg | INTRAMUSCULAR | Status: DC

## 2019-07-29 MED ORDER — PROPOFOL 10 MG/ML IV BOLUS
INTRAVENOUS | Status: DC | PRN
  Administered 2019-07-29: 140 mg via INTRAVENOUS

## 2019-07-29 MED ORDER — CIPROFLOXACIN IN D5W 400 MG/200ML IV SOLN
400.0000 mg | INTRAVENOUS | Status: AC
  Administered 2019-07-29: 08:00:00 400 mg via INTRAVENOUS
  Filled 2019-07-29: qty 200

## 2019-07-29 MED ORDER — LACTATED RINGERS IR SOLN
Status: DC | PRN
  Administered 2019-07-29: 1000 mL

## 2019-07-29 MED ORDER — STERILE WATER FOR IRRIGATION IR SOLN
Status: DC | PRN
  Administered 2019-07-29: 1000 mL

## 2019-07-29 MED ORDER — LIDOCAINE 2% (20 MG/ML) 5 ML SYRINGE
INTRAMUSCULAR | Status: DC | PRN
  Administered 2019-07-29: 100 mg via INTRAVENOUS

## 2019-07-29 MED ORDER — LIDOCAINE 20MG/ML (2%) 15 ML SYRINGE OPTIME
INTRAMUSCULAR | Status: DC | PRN
  Administered 2019-07-29: 1.5 mg/kg/h via INTRAVENOUS

## 2019-07-29 MED ORDER — MIDAZOLAM HCL 2 MG/2ML IJ SOLN
INTRAMUSCULAR | Status: AC
  Filled 2019-07-29: qty 2

## 2019-07-29 MED ORDER — MIDAZOLAM HCL 2 MG/2ML IJ SOLN
INTRAMUSCULAR | Status: DC | PRN
  Administered 2019-07-29: 1 mg via INTRAVENOUS

## 2019-07-29 MED ORDER — LACTATED RINGERS IV SOLN
INTRAVENOUS | Status: DC
  Administered 2019-07-29: 07:00:00 via INTRAVENOUS

## 2019-07-29 MED ORDER — STERILE WATER FOR INJECTION IJ SOLN
INTRAMUSCULAR | Status: AC
  Filled 2019-07-29: qty 10

## 2019-07-29 MED ORDER — GABAPENTIN 300 MG PO CAPS
300.0000 mg | ORAL_CAPSULE | ORAL | Status: AC
  Administered 2019-07-29: 300 mg via ORAL
  Filled 2019-07-29: qty 1

## 2019-07-29 MED ORDER — CLINDAMYCIN PHOSPHATE 900 MG/50ML IV SOLN
900.0000 mg | INTRAVENOUS | Status: AC
  Administered 2019-07-29: 900 mg via INTRAVENOUS
  Filled 2019-07-29: qty 50

## 2019-07-29 MED ORDER — ENOXAPARIN SODIUM 40 MG/0.4ML ~~LOC~~ SOLN
40.0000 mg | SUBCUTANEOUS | Status: AC
  Administered 2019-07-29: 07:00:00 40 mg via SUBCUTANEOUS
  Filled 2019-07-29: qty 0.4

## 2019-07-29 MED ORDER — ESMOLOL HCL 100 MG/10ML IV SOLN
INTRAVENOUS | Status: DC | PRN
  Administered 2019-07-29 (×2): 20 mg via INTRAVENOUS

## 2019-07-29 SURGICAL SUPPLY — 61 items
APPLICATOR SURGIFLO ENDO (HEMOSTASIS) IMPLANT
BAG LAPAROSCOPIC 12 15 PORT 16 (BASKET) IMPLANT
BAG RETRIEVAL 12/15 (BASKET)
BLADE SURG SZ10 CARB STEEL (BLADE) IMPLANT
COVER BACK TABLE 60X90IN (DRAPES) ×2 IMPLANT
COVER TIP SHEARS 8 DVNC (MISCELLANEOUS) ×1 IMPLANT
COVER TIP SHEARS 8MM DA VINCI (MISCELLANEOUS) ×1
COVER WAND RF STERILE (DRAPES) IMPLANT
DECANTER SPIKE VIAL GLASS SM (MISCELLANEOUS) ×1 IMPLANT
DERMABOND ADVANCED (GAUZE/BANDAGES/DRESSINGS) ×1
DERMABOND ADVANCED .7 DNX12 (GAUZE/BANDAGES/DRESSINGS) ×1 IMPLANT
DRAPE ARM DVNC X/XI (DISPOSABLE) ×4 IMPLANT
DRAPE COLUMN DVNC XI (DISPOSABLE) ×1 IMPLANT
DRAPE DA VINCI XI ARM (DISPOSABLE) ×4
DRAPE DA VINCI XI COLUMN (DISPOSABLE) ×1
DRAPE SHEET LG 3/4 BI-LAMINATE (DRAPES) ×2 IMPLANT
DRAPE SURG IRRIG POUCH 19X23 (DRAPES) ×2 IMPLANT
DRSG OPSITE POSTOP 4X6 (GAUZE/BANDAGES/DRESSINGS) IMPLANT
DRSG OPSITE POSTOP 4X8 (GAUZE/BANDAGES/DRESSINGS) IMPLANT
ELECT REM PT RETURN 15FT ADLT (MISCELLANEOUS) ×2 IMPLANT
GLOVE BIO SURGEON STRL SZ 6 (GLOVE) ×8 IMPLANT
GLOVE BIO SURGEON STRL SZ 6.5 (GLOVE) ×2 IMPLANT
GOWN STRL REUS W/ TWL LRG LVL3 (GOWN DISPOSABLE) ×4 IMPLANT
GOWN STRL REUS W/TWL LRG LVL3 (GOWN DISPOSABLE) ×4
HOLDER FOLEY CATH W/STRAP (MISCELLANEOUS) ×2 IMPLANT
IRRIG SUCT STRYKERFLOW 2 WTIP (MISCELLANEOUS) ×2
IRRIGATION SUCT STRKRFLW 2 WTP (MISCELLANEOUS) ×1 IMPLANT
KIT PROCEDURE DA VINCI SI (MISCELLANEOUS) ×1
KIT PROCEDURE DVNC SI (MISCELLANEOUS) IMPLANT
KIT TURNOVER KIT A (KITS) IMPLANT
MANIPULATOR UTERINE 4.5 ZUMI (MISCELLANEOUS) ×2 IMPLANT
NDL SPNL 18GX3.5 QUINCKE PK (NEEDLE) IMPLANT
NEEDLE HYPO 22GX1.5 SAFETY (NEEDLE) IMPLANT
NEEDLE SPNL 18GX3.5 QUINCKE PK (NEEDLE) ×2 IMPLANT
OBTURATOR OPTICAL STANDARD 8MM (TROCAR) ×1
OBTURATOR OPTICAL STND 8 DVNC (TROCAR) ×1
OBTURATOR OPTICALSTD 8 DVNC (TROCAR) ×1 IMPLANT
PACK ROBOT GYN CUSTOM WL (TRAY / TRAY PROCEDURE) ×2 IMPLANT
PAD POSITIONING PINK XL (MISCELLANEOUS) ×2 IMPLANT
PORT ACCESS TROCAR AIRSEAL 12 (TROCAR) ×1 IMPLANT
PORT ACCESS TROCAR AIRSEAL 5M (TROCAR) ×1
POUCH SPECIMEN RETRIEVAL 10MM (ENDOMECHANICALS) IMPLANT
SEAL CANN UNIV 5-8 DVNC XI (MISCELLANEOUS) ×3 IMPLANT
SEAL XI 5MM-8MM UNIVERSAL (MISCELLANEOUS) ×3
SET TRI-LUMEN FLTR TB AIRSEAL (TUBING) ×2 IMPLANT
SPONGE LAP 18X18 X RAY DECT (DISPOSABLE) IMPLANT
SURGIFLO W/THROMBIN 8M KIT (HEMOSTASIS) IMPLANT
SUT MNCRL AB 4-0 PS2 18 (SUTURE) IMPLANT
SUT PDS AB 1 TP1 96 (SUTURE) IMPLANT
SUT VIC AB 0 CT1 27 (SUTURE) ×1
SUT VIC AB 0 CT1 27XBRD ANTBC (SUTURE) IMPLANT
SUT VIC AB 2-0 CT1 27 (SUTURE)
SUT VIC AB 2-0 CT1 TAPERPNT 27 (SUTURE) IMPLANT
SUT VIC AB 4-0 PS2 18 (SUTURE) ×4 IMPLANT
SYR 10ML LL (SYRINGE) ×1 IMPLANT
TOWEL OR NON WOVEN STRL DISP B (DISPOSABLE) ×2 IMPLANT
TRAP SPECIMEN MUCOUS 40CC (MISCELLANEOUS) IMPLANT
TRAY FOLEY MTR SLVR 16FR STAT (SET/KITS/TRAYS/PACK) ×2 IMPLANT
UNDERPAD 30X36 HEAVY ABSORB (UNDERPADS AND DIAPERS) ×2 IMPLANT
WATER STERILE IRR 1000ML POUR (IV SOLUTION) ×2 IMPLANT
YANKAUER SUCT BULB TIP 10FT TU (MISCELLANEOUS) IMPLANT

## 2019-07-29 NOTE — Transfer of Care (Signed)
Immediate Anesthesia Transfer of Care Note  Patient: Hannah Decker  Procedure(s) Performed: ROBOTIC ASSISTED TOTAL HYSTERECTOMY WITH BILATERAL SALPINGO OOPHORECTOMY (Bilateral ) SENTINEL NODE BIOPSY (Bilateral )  Patient Location: PACU  Anesthesia Type:General  Level of Consciousness: awake, alert  and patient cooperative  Airway & Oxygen Therapy: Patient Spontanous Breathing and Patient connected to face mask oxygen  Post-op Assessment: Report given to RN, Post -op Vital signs reviewed and stable and Patient moving all extremities X 4  Post vital signs: Reviewed and stable  Last Vitals:  Vitals Value Taken Time  BP 174/81 07/29/19 0935  Temp    Pulse 79 07/29/19 0937  Resp 20 07/29/19 0937  SpO2 98 % 07/29/19 0937  Vitals shown include unvalidated device data.  Last Pain:  Vitals:   07/29/19 0634  TempSrc:   PainSc: 0-No pain         Complications: No apparent anesthesia complications

## 2019-07-29 NOTE — Anesthesia Postprocedure Evaluation (Signed)
Anesthesia Post Note  Patient: Hannah Decker  Procedure(s) Performed: ROBOTIC ASSISTED TOTAL HYSTERECTOMY WITH BILATERAL SALPINGO OOPHORECTOMY (Bilateral ) SENTINEL NODE BIOPSY (Bilateral )     Patient location during evaluation: PACU Anesthesia Type: General Level of consciousness: awake and alert Pain management: pain level controlled Vital Signs Assessment: post-procedure vital signs reviewed and stable Respiratory status: spontaneous breathing, nonlabored ventilation and respiratory function stable Cardiovascular status: blood pressure returned to baseline and stable Postop Assessment: no apparent nausea or vomiting Anesthetic complications: no    Last Vitals:  Vitals:   07/29/19 0945 07/29/19 1000  BP: (!) 182/92 (!) 180/94  Pulse: 75 86  Resp: 16 14  Temp: (!) 36 C 36.5 C  SpO2: 98% 100%    Last Pain:  Vitals:   07/29/19 1000  TempSrc:   PainSc: 0-No pain                 Rhilynn Preyer,W. EDMOND

## 2019-07-29 NOTE — Anesthesia Procedure Notes (Addendum)
Procedure Name: Intubation Date/Time: 07/29/2019 7:34 AM Performed by: Eben Burow, CRNA Pre-anesthesia Checklist: Patient identified, Emergency Drugs available, Suction available, Patient being monitored and Timeout performed Patient Re-evaluated:Patient Re-evaluated prior to induction Oxygen Delivery Method: Circle system utilized Preoxygenation: Pre-oxygenation with 100% oxygen Induction Type: IV induction and Rapid sequence Laryngoscope Size: Mac and 3 Grade View: Grade I Tube type: Oral Tube size: 7.0 mm Number of attempts: 1 Airway Equipment and Method: Stylet Placement Confirmation: ETT inserted through vocal cords under direct vision,  positive ETCO2 and breath sounds checked- equal and bilateral Secured at: 22 cm Tube secured with: Tape Dental Injury: Teeth and Oropharynx as per pre-operative assessment  Comments: Intubated by Orma Flaming, SRNA

## 2019-07-29 NOTE — Op Note (Signed)
OPERATIVE NOTE 07/29/19  Surgeon: Donaciano Eva   Assistants: Dr Lahoma Crocker (an MD assistant was necessary for tissue manipulation, management of robotic instrumentation, retraction and positioning due to the complexity of the case and hospital policies).   Anesthesia: General endotracheal anesthesia  ASA Class: 3   Pre-operative Diagnosis: endometrial cancer grade 1  Post-operative Diagnosis: same,   Operation: Robotic-assisted laparoscopic total hysterectomy with bilateral salpingoophorectomy, SLN biopsy   Surgeon: Donaciano Eva  Assistant Surgeon: Lahoma Crocker MD  Anesthesia: GET  Urine Output: 250cc  Operative Findings:  : 10cm fibroid uterus, normal appearing tubes and ovaries, no suspicous nodes  Estimated Blood Loss:  less than 20 mL      Total IV Fluids: 600 ml         Specimens: uterus, cervix, bilateral tubes and ovaries, right obturator SLN, right presacral SLN, left deep obturator SLN         Complications:  None; patient tolerated the procedure well.         Disposition: PACU - hemodynamically stable.  Procedure Details  The patient was seen in the Holding Room. The risks, benefits, complications, treatment options, and expected outcomes were discussed with the patient.  The patient concurred with the proposed plan, giving informed consent.  The site of surgery properly noted/marked. The patient was identified as Hannah Decker and the procedure verified as a Robotic-assisted hysterectomy with bilateral salpingo oophorectomy with SLN biopsy. A Time Out was held and the above information confirmed.  After induction of anesthesia, the patient was draped and prepped in the usual sterile manner. Pt was placed in supine position after anesthesia and draped and prepped in the usual sterile manner. The abdominal drape was placed after the CholoraPrep had been allowed to dry for 3 minutes.  Her arms were tucked to her side with all appropriate  precautions.  The shoulders were stabilized with padded shoulder blocks applied to the acromium processes.  The patient was placed in the semi-lithotomy position in [place].  The perineum was prepped with Betadine. The patient was then prepped. Foley catheter was placed.  A sterile speculum was placed in the vagina.  The cervix was grasped with a single-tooth tenaculum. 2mg  total of ICG was injected into the cervical stroma at 2 and 9 o'clock with 1cc injected at a 1cm and 58mm depth (concentration 0.5mg /ml) in all locations. The cervix was dilated with Kennon Rounds dilators.  The ZUMI uterine manipulator with a medium colpotomizer ring was placed without difficulty.  A pneum occluder balloon was placed over the manipulator.  OG tube placement was confirmed and to suction.   Next, a 5 mm skin incision was made 1 cm below the subcostal margin in the midclavicular line.  The 5 mm Optiview port and scope was used for direct entry.  Opening pressure was under 10 mm CO2.  The abdomen was insufflated and the findings were noted as above.   At this point and all points during the procedure, the patient's intra-abdominal pressure did not exceed 15 mmHg. Next, a 10 mm skin incision was made in the umbilicus and a right and left port was placed about 10 cm lateral to the robot port on the right and left side.   All ports were placed under direct visualization.  The patient was placed in steep Trendelenburg.  Bowel was folded away into the upper abdomen.  The robot was docked in the normal manner.  The right and left peritoneum were opened parallel to  the IP ligament to open the retroperitoneal spaces bilaterally. The SLN mapping was performed in bilateral pelvic basins. The para rectal and paravesical spaces were opened up entirely with careful dissection below the level of the ureters bilaterally and to the depth of the uterine artery origin in order to skeletonize the uterine "web" and ensure visualization of all  parametrial channels. The para-aortic basins were carefully exposed and evaluated for isolated para-aortic SLN's. Lymphatic channels were identified travelling to the following visualized sentinel lymph node's: right obturator SLN, right presacral SLN, left deep obturator SLN (deep obturator SLN). These SLN's were separated from their surrounding lymphatic tissue, removed and sent for permanent pathology.  The hysterectomy was started after the round ligament on the right side was incised and the retroperitoneum was entered and the pararectal space was developed.  The ureter was noted to be on the medial leaf of the broad ligament.  The peritoneum above the ureter was incised and stretched and the infundibulopelvic ligament was skeletonized, cauterized and cut.  The posterior peritoneum was taken down to the level of the KOH ring.  The anterior peritoneum was also taken down.  The bladder flap was created to the level of the KOH ring.  The uterine artery on the right side was skeletonized, cauterized and cut in the normal manner.  A similar procedure was performed on the left.  The colpotomy was made and the uterus, cervix, bilateral ovaries and tubes were amputated and delivered through the vagina.  Pedicles were inspected and excellent hemostasis was achieved.    The colpotomy at the vaginal cuff was closed with Vicryl on a CT1 needle in a running manner.  Irrigation was used and excellent hemostasis was achieved.  At this point in the procedure was completed.  Robotic instruments were removed under direct visulaization.  The robot was undocked. The 10 mm ports were closed with Vicryl on a UR-5 needle and the fascia was closed with 0 Vicryl on a UR-5 needle.  The skin was closed with 4-0 Vicryl in a subcuticular manner.  Dermabond was applied.  Sponge, lap and needle counts correct x 2.  The patient was taken to the recovery room in stable condition.  The vagina was swabbed with  minimal bleeding noted.    All instrument and needle counts were correct x  3.   The patient was transferred to the recovery room in a stable condition.  Donaciano Eva, MD

## 2019-07-29 NOTE — Interval H&P Note (Signed)
History and Physical Interval Note:  07/29/2019 7:05 AM  Hannah Decker  has presented today for surgery, with the diagnosis of ENDOMETRIAL CANCER.  The various methods of treatment have been discussed with the patient and family. After consideration of risks, benefits and other options for treatment, the patient has consented to  Procedure(s): ROBOTIC ASSISTED TOTAL HYSTERECTOMY WITH BILATERAL SALPINGO OOPHORECTOMY (Bilateral) SENTINEL NODE BIOPSY (Bilateral) as a surgical intervention.  The patient's history has been reviewed, patient examined, no change in status, stable for surgery.  I have reviewed the patient's chart and labs.  Questions were answered to the patient's satisfaction.     Thereasa Solo

## 2019-07-29 NOTE — Discharge Instructions (Signed)
Return to work: 4 weeks (2 weeks with physical restrictions). ° °Activity: °1. Be up and out of the bed during the day.  Take a nap if needed.  You may walk up steps but be careful and use the hand rail.  Stair climbing will tire you more than you think, you may need to stop part way and rest.  ° °2. No lifting or straining for 4 weeks. ° °3. No driving for 1 weeks.  Do Not drive if you are taking narcotic pain medicine. ° °4. Shower daily.  Use soap and water on your incision and pat dry; don't rub.  ° °5. No sexual activity and nothing in the vagina for 8 weeks. ° °Medications:  °- Take ibuprofen and tylenol first line for pain control. Take these regularly (every 6 hours) to decrease the build up of pain. ° °- If necessary, for severe pain not relieved by ibuprofen, contact Dr Rossi's office and you will be prescribed percocet. ° °- While taking percocet you should take sennakot every night to reduce the likelihood of constipation. If this causes diarrhea, stop its use. ° °Diet: °1. Low sodium Heart Healthy Diet is recommended. ° °2. It is safe to use a laxative if you have difficulty moving your bowels.  ° °Wound Care: °1. Keep clean and dry.  Shower daily. ° °Reasons to call the Doctor: ° °· Fever - Oral temperature greater than 100.4 degrees Fahrenheit °· Foul-smelling vaginal discharge °· Difficulty urinating °· Nausea and vomiting °· Increased pain at the site of the incision that is unrelieved with pain medicine. °· Difficulty breathing with or without chest pain °· New calf pain especially if only on one side °· Sudden, continuing increased vaginal bleeding with or without clots. °  °Follow-up: °1. See Emma Rossi in 4 weeks. ° °Contacts: °For questions or concerns you should contact: ° °Dr. Emma Rossi at [phone] °After hours and on week-ends call [phone] and ask to speak to the physician on call for Gynecologic Oncology ° °After Your Surgery ° °The information in this section will tell you what  to expect after your surgery, both during your stay and after you leave. You will learn how to safely recover from your surgery. Write down any questions you have and be sure to ask your doctor or nurse. °What to Expect °When you wake up after your surgery, you will be in the Post-Anesthesia Care Unit (PACU) or your °recovery room. °A nurse will be monitoring your body temperature, blood pressure, pulse, and oxygen levels. °You may have a urinary catheter in your bladder to help monitor the amount of urine you are making. It should come out before you go home. You will also have compression boots on your lower legs to help your circulation. °Your pain medication will be given through an IV line or in tablet form. If you are having pain, tell your nurse. °Your nurse will tell you how to recover from your surgery. Below are examples of ways you can help yourself recover safely. °• You will be encouraged to walk with the help of your nurse or physical therapist. We will give you medication to relieve pain. Walking helps reduce the risk for blood clots and pneumonia. It also helps to stimulate your bowels so they begin working again. °• Use your incentive spirometer. This will help your lungs expand, which prevents pneumonia. For more information, read How to Use Your Incentive Spirometer. °Commonly Asked Questions °Will I have pain after   surgery? °Yes, you will have some pain after your surgery, especially in the first few days. Your doctor and nurse will ask you about your pain often. You will be given medication to manage your pain as needed. If your pain is not relieved, please tell your doctor or nurse. It is important to control your pain so you can cough, breathe deeply, use your incentive spirometer, and get out of bed and walk. °Will I be able to eat? °Yes, you will be able to eat a regular diet or eat as tolerated. You should start with foods that are soft and easy to digest such as apple sauce and chicken  noodle soup. Eat small meals frequently, and then advance to regular foods. °If you experience bloating, gas, or cramps, limit high-fiber foods, including whole grain breads and cereal, nuts, seeds, salads, fresh fruit, broccoli, cabbage, and cauliflower. °Will I have pain when I am home? °The length of time each person has pain or discomfort varies. You may still have some pain when you go home and will probably be taking pain medication. Follow the guidelines below. °• Take your medications as directed and as needed. °• Call your doctor if the medication prescribed for you doesn't relieve your pain. °• Don't drive or drink alcohol while you're taking prescription pain medication. °• As your incision heals, you will have less pain and need less pain medication. A mild pain reliever such as acetaminophen (Tylenol®) or ibuprofen (Advil®) will relieve aches and discomfort. However, large quantities of acetaminophen may be harmful to your liver. Don't take more acetaminophen than the amount directed on the bottle or as instructed by your doctor or nurse. °• Pain medication should help you as you resume your normal activities. Take enough medication to do your exercises comfortably. Pain medication is most effective 30 to 45 minutes after taking it. °• Keep track of when you take your pain medication. Taking it when your pain first begins is more effective than waiting for the pain to get worse. °Pain medication may cause constipation (having fewer bowel movements than what is normal for you). °How can I prevent constipation? °• Go to the bathroom at the same time every day. Your body will get used to going at that time. °• If you feel the urge to go, don't put it off. Try to use the bathroom 5 to 15 minutes after meals. °• After breakfast is a good time to move your bowels. The reflexes in your colon are strongest at this time. °• Exercise, if you can. Walking is an excellent form of exercise. °• Drink 8 (8-ounce)  glasses (2 liters) of liquids daily, if you can. Drink water, juices, soups, ice cream shakes, and other drinks that don't have caffeine. Drinks with caffeine, such as coffee and soda, pull fluid out of the body. °• Slowly increase the fiber in your diet to 25 to 35 grams per day. Fruits, vegetables, whole grains, and cereals contain fiber. If you have an ostomy or have had recent bowel surgery, check with your doctor or nurse before making any changes in your diet. °• Both over-the-counter and prescription medications are available to treat constipation. Start with 1 of the following over-the-counter medications first: °o Docusate sodium (Colace®) 100 mg. Take ___1__ capsules _2____ times a day. This is a stool softener that causes few side effects. Don't take it with mineral oil. °o Polyethylene glycol (MiraLAX®) 17 grams daily. °o Senna (Senokot®) 2 tablets at bedtime. This is a stimulant laxative,   which can cause cramping. °• If you haven't had a bowel movement in 2 days, call your doctor or nurse. °Can I shower? °Yes, you should shower 24 hours after your surgery. Be sure to shower every day. °Taking a warm shower is relaxing and can help decrease muscle aches. Use soap when you shower and gently wash your incision. Pat the areas dry with a towel after showering, and leave your incision uncovered (unless there is drainage). Call your doctor if you see any redness or drainage from your incision. °Don't take tub baths until you discuss it with your doctor at the first appointment after your surgery. °How do I care for my incisions? °You will have several small incisions on your abdomen. The incisions are closed with Steri-Strips or Dermabond. You may also have square white dressings on your incisions (Primapore®). You can remove these in the shower 24 hours after your surgery. You should clean your incisions with soap and water. °If you go home with Steri-Strips on your incision, they will loosen and may fall off  by themselves. If they haven't fallen off within 10 days, you can remove them. °If you go home with Dermabond over your sutures (stitches), it will also loosen and peel off. °What are the most common symptoms after a hysterectomy? °It's common for you to have some vaginal spotting or light bleeding. You should monitor this with a pad or a panty liner. If you have having heavy bleeding (bleeding through a pad or liner every 1 to 2 hours), call your doctor right away. °It's also common to have some discomfort after surgery from the air that was pumped into your abdomen during surgery. To help with this, walk, drink plenty of liquids and make sure to take the stool softeners °you received. °When is it safe for me to drive? °You may resume driving 2 weeks after surgery, as long as you are not taking pain medication that may make you drowsy. °When can I resume sexual activity? °Do not place anything in your vagina or have vaginal intercourse for 8 weeks after your surgery. Some people will need to wait longer than 8 weeks, so speak with your doctor before resuming sexual intercourse. °Will I be able to travel? °Yes, you can travel. If you are traveling by plane within a few weeks after your surgery, make sure you get up and walk every hour. Be sure to stretch your legs, drink plenty of liquids, and keep your feet elevated when possible. °Will I need any supplies? °Most people do not need any supplies after the surgery. In the rare case that you do need supplies, such as tubes or drains, your nurse will order them for you. °When can I return to work? °The time it takes to return to work depends on the type of work you do, the type of surgery you had, and how fast your body heals. Most people can return to work about 2 to 4 weeks after the surgery. °What exercises can I do? °Exercise will help you gain strength and feel better. Walking and stair climbing are excellent forms of exercise. Gradually increase the distance you  walk. Climb stairs slowly, resting or stopping as needed. Ask your doctor or nurse before starting more strenuous exercises. °When can I lift heavy objects? °Most people should not lift anything heavier than 10 pounds (4.5 kilograms) for at least 4 weeks after surgery. Speak with your doctor about when you can do heavy lifting. °How can I cope with   my feelings? °After surgery for a serious illness, you may have new and upsetting feelings. Many people say they felt weepy, sad, worried, nervous, irritable, and angry at one time or another. You may find that you can't control some of these feelings. If this happens, it's a good idea to seek emotional support. °The first step in coping is to talk about how you feel. Family and friends can help. Your nurse, doctor, and social worker can reassure, support, and guide you. It's always a good idea to let these professionals know how you, your family, and your friends are feeling emotionally. Many resources are available to patients and their families. Whether you're in the hospital or at home, the nurses, doctors, and social workers are here to help you and your family and friends handle the emotional aspects of your illness. °When is my first appointment after surgery? °Your first appointment after surgery will be 2 to 4 weeks after surgery. Your nurse will give you instructions on how to make this appointment, including the phone number to call. °What if I have other questions? °If you have any questions or concerns, please talk with your doctor or nurse. You can reach them Monday through Friday from 9:00 am to 5:00 pm. °After 5:00 pm, during the weekend, and on holidays, call [phone] and ask for the doctor on call for your doctor. ° °• Have a temperature of 101° F (38.3° C) or higher °• Have pain that does not get better with pain medication °• Have redness, drainage, or swelling from your incisions ° ° °General Anesthesia, Adult, Care After °This sheet gives you  information about how to care for yourself after your procedure. Your health care provider may also give you more specific instructions. If you have problems or questions, contact your health care provider. °What can I expect after the procedure? °After the procedure, the following side effects are common: °· Pain or discomfort at the IV site. °· Nausea. °· Vomiting. °· Sore throat. °· Trouble concentrating. °· Feeling cold or chills. °· Weak or tired. °· Sleepiness and fatigue. °· Soreness and body aches. These side effects can affect parts of the body that were not involved in surgery. °Follow these instructions at home: ° °For at least 24 hours after the procedure: °· Have a responsible adult stay with you. It is important to have someone help care for you until you are awake and alert. °· Rest as needed. °· Do not: °? Participate in activities in which you could fall or become injured. °? Drive. °? Use heavy machinery. °? Drink alcohol. °? Take sleeping pills or medicines that cause drowsiness. °? Make important decisions or sign legal documents. °? Take care of children on your own. °Eating and drinking °· Follow any instructions from your health care provider about eating or drinking restrictions. °· When you feel hungry, start by eating small amounts of foods that are soft and easy to digest (bland), such as toast. Gradually return to your regular diet. °· Drink enough fluid to keep your urine pale yellow. °· If you vomit, rehydrate by drinking water, juice, or clear broth. °General instructions °· If you have sleep apnea, surgery and certain medicines can increase your risk for breathing problems. Follow instructions from your health care provider about wearing your sleep device: °? Anytime you are sleeping, including during daytime naps. °? While taking prescription pain medicines, sleeping medicines, or medicines that make you drowsy. °· Return to your normal activities as told by   your health care  provider. Ask your health care provider what activities are safe for you. °· Take over-the-counter and prescription medicines only as told by your health care provider. °· If you smoke, do not smoke without supervision. °· Keep all follow-up visits as told by your health care provider. This is important. °Contact a health care provider if: °· You have nausea or vomiting that does not get better with medicine. °· You cannot eat or drink without vomiting. °· You have pain that does not get better with medicine. °· You are unable to pass urine. °· You develop a skin rash. °· You have a fever. °· You have redness around your IV site that gets worse. °Get help right away if: °· You have difficulty breathing. °· You have chest pain. °· You have blood in your urine or stool, or you vomit blood. °Summary °· After the procedure, it is common to have a sore throat or nausea. It is also common to feel tired. °· Have a responsible adult stay with you for the first 24 hours after general anesthesia. It is important to have someone help care for you until you are awake and alert. °· When you feel hungry, start by eating small amounts of foods that are soft and easy to digest (bland), such as toast. Gradually return to your regular diet. °· Drink enough fluid to keep your urine pale yellow. °· Return to your normal activities as told by your health care provider. Ask your health care provider what activities are safe for you. °This information is not intended to replace advice given to you by your health care provider. Make sure you discuss any questions you have with your health care provider. °Document Released: 02/05/2001 Document Revised: 11/02/2017 Document Reviewed: 06/15/2017 °[employer] Patient Education © 2020 [employer] ° °

## 2019-07-30 ENCOUNTER — Telehealth: Payer: Self-pay

## 2019-07-30 ENCOUNTER — Ambulatory Visit: Payer: No Typology Code available for payment source | Admitting: Obstetrics & Gynecology

## 2019-07-30 ENCOUNTER — Encounter (HOSPITAL_COMMUNITY): Payer: Self-pay | Admitting: Gynecologic Oncology

## 2019-07-30 NOTE — Telephone Encounter (Signed)
Ms Consoli states that she is eating, drinking, and urinating well. Passing gas. Afebrile. Pt abdomen is a little achy. Her nausea is improving from yesterday. Pt aware of post op appointments and office number if she has any questions or concerns.

## 2019-08-01 ENCOUNTER — Other Ambulatory Visit: Payer: Self-pay

## 2019-08-04 ENCOUNTER — Other Ambulatory Visit: Payer: Self-pay

## 2019-08-04 LAB — SURGICAL PATHOLOGY

## 2019-08-05 ENCOUNTER — Other Ambulatory Visit: Payer: Self-pay

## 2019-08-05 ENCOUNTER — Telehealth: Payer: Self-pay | Admitting: *Deleted

## 2019-08-05 ENCOUNTER — Inpatient Hospital Stay: Payer: Self-pay | Admitting: Gynecologic Oncology

## 2019-08-05 NOTE — Telephone Encounter (Signed)
Called and rescheduled patient's appt from today to Thursday

## 2019-08-07 ENCOUNTER — Encounter: Payer: Self-pay | Admitting: Gynecologic Oncology

## 2019-08-07 ENCOUNTER — Inpatient Hospital Stay (HOSPITAL_BASED_OUTPATIENT_CLINIC_OR_DEPARTMENT_OTHER): Payer: Self-pay | Admitting: Gynecologic Oncology

## 2019-08-07 DIAGNOSIS — C541 Malignant neoplasm of endometrium: Secondary | ICD-10-CM

## 2019-08-07 DIAGNOSIS — Z90722 Acquired absence of ovaries, bilateral: Secondary | ICD-10-CM

## 2019-08-07 DIAGNOSIS — Z7189 Other specified counseling: Secondary | ICD-10-CM

## 2019-08-07 DIAGNOSIS — Z90710 Acquired absence of both cervix and uterus: Secondary | ICD-10-CM

## 2019-08-07 NOTE — Progress Notes (Signed)
Gynecologic Oncology Telehealth Consult Note: Gyn-Onc  I connected with Hannah Decker on 08/07/19 at  3:30 PM EDT by telephone and verified that I am speaking with the correct person using two identifiers.  I discussed the limitations, risks, security and privacy concerns of performing an evaluation and management service by telemedicine and the availability of in-person appointments. I also discussed with the patient that there may be a patient responsible charge related to this service. The patient expressed understanding and agreed to proceed.  Other persons participating in the visit and their role in the encounter: none.  Patient's location: home Provider's location: [place]  Chief Complaint:  Chief Complaint  Patient presents with  . endometrial cancer   Assessment/Plan: 63 year old with a clinical stage I grade 1 endometrioid adenocarcinoma. MSI pending.   Pathology revealed low risk factors for recurrence, therefore no adjuvant therapy is recommended according to NCCN guidelines.  I discussed risk for recurrence and typical symptoms encouraged her to notify us of these should they develop between visits.  I recommend she have follow-up every 6 months for 5 years in accordance with NCCN guidelines. Those visits should include symptom assessment, physical exam and pelvic examination. Pap smears are not indicated or recommended in the routine surveillance of endometrial cancer.   HPI: Patient is seen today in consultation at the request of Dr. Hulan Fray. Patient is a 63 year old para 4 who had an ultrasound done in February 2000 secondary to left lower quadrant pain and it showed an endometrium of 7.7 mm as well as fibroids.  She then re-presented with a 1 year history of postmenopausal bleeding.  Therefore, an endometrial biopsy was performed August 21 with the results as below.  Endometrial biopsy 07/04/19: Diagnosis Endometrium, biopsy - ENDOMETRIOID CARCINOMA -  SEE COMMENT Microscopic Comment Based on the biopsy the carcinoma appears FIGO grade 1.   Patient states that she has been menopausal since her late 51s.  She is never taken any hormone replacement therapy.  She states that about 2 years ago she started having some spotting and is progressively gotten heavier up to having some fairly large clots.  Now it is lighter since her biopsy.  She denies any cramping associated with the bleeding.  However, she states that for the past 3 to 4 weeks she is had some left-sided pain.  She thinks that it might of started after she picked up her mom who passed out from dehydration and they had to call EMS.  She has not tried any pain medication, heating pad etc. for this.  She states that her children told her she might of pulled a muscle.  She experiences this pain when she drinks water or swallows her saliva.  She is not really had any health care in the past 3 to 4 years.  She states she is never had a colonoscopy and her last mammogram was more than 4 years ago.  She does not have a primary care physician.  She does experience some nausea in the morning and it gets better after she eats.  She is very confused about allergies versus side effects.  She states that Advil causes her to get constipated but ibuprofen causes her to itch.  When I discussed that Advil and ibuprofen are essentially same medication she did not really know what to say and thinks that "it might be in my head".  She also states that she might be allergic to Tylenol but similarly could not really quantify what  type of reaction she has.  Interval Hx:  On 07/29/19 she underwent a robotic assisted TLH, BSO and SLN biopsy. Surgery was uncomplicated. Final pathology revealed a stage IA grade 1 endometrial cancer with inner half myometrial invasion, no LVSI, Negative nodes/adnexa/cervix.  She was determined to have low risk factors and therefore no adjuvant therapy was recommended in accordance with NCCN  guidelines.   Review of Systems: Constitutional: Denies fever, unintentional weight loss or weight gain. Skin: No rash Cardiovascular: No chest pain, shortness of breath, or edema  Pulmonary: No cough Gastro Intestinal: Reporting intermittent left-sided abdominal soreness.  No nausea, vomiting, constipation, or diarrhea reported.  Genitourinary: No frequency, urgency, or dysuria.  No bleeding Musculoskeletal: No joint pain.  Psychology: No complaints  Current Meds:  Outpatient Encounter Medications as of 08/07/2019  Medication Sig  . omeprazole (PRILOSEC) 20 MG capsule Take 1 capsule (20 mg total) by mouth daily.  . ondansetron (ZOFRAN) 4 MG tablet Take 1 tablet (4 mg total) by mouth every 6 (six) hours.  . senna-docusate (SENOKOT-S) 8.6-50 MG tablet Take 2 tablets by mouth at bedtime. For AFTER surgery, do not take if having diarrhea  . traMADol (ULTRAM) 50 MG tablet Take 1 tablet (50 mg total) by mouth every 6 (six) hours as needed for severe pain. For AFTER surgery only. Do not take and drive.   No facility-administered encounter medications on file as of 08/07/2019.     Allergy:  Allergies  Allergen Reactions  . Penicillins Hives    Did it involve swelling of the face/tongue/throat, SOB, or low BP? No Did it involve sudden or severe rash/hives, skin peeling, or any reaction on the inside of your mouth or nose? No Did you need to seek medical attention at a hospital or doctor's office? No When did it last happen?10 years ago If all above answers are "NO", may proceed with cephalosporin use.   . Ace Inhibitors Cough  . Hydrochlorothiazide     REACTION: ? eyes turn yellow    Social Hx:   Social History   Socioeconomic History  . Marital status: Single    Spouse name: Not on file  . Number of children: Not on file  . Years of education: Not on file  . Highest education level: Not on file  Occupational History  . Not on file  Social Needs  . Financial resource  strain: Not on file  . Food insecurity    Worry: Not on file    Inability: Not on file  . Transportation needs    Medical: Not on file    Non-medical: Not on file  Tobacco Use  . Smoking status: Never Smoker  . Smokeless tobacco: Never Used  Substance and Sexual Activity  . Alcohol use: No  . Drug use: No  . Sexual activity: Not on file  Lifestyle  . Physical activity    Days per week: Not on file    Minutes per session: Not on file  . Stress: Not on file  Relationships  . Social Herbalist on phone: Not on file    Gets together: Not on file    Attends religious service: Not on file    Active member of club or organization: Not on file    Attends meetings of clubs or organizations: Not on file    Relationship status: Not on file  . Intimate partner violence    Fear of current or ex partner: Not on file  Emotionally abused: Not on file    Physically abused: Not on file    Forced sexual activity: Not on file  Other Topics Concern  . Not on file  Social History Narrative  . Not on file    Past Surgical Hx:  Past Surgical History:  Procedure Laterality Date  . HERNIA REPAIR    . ROBOTIC ASSISTED TOTAL HYSTERECTOMY WITH BILATERAL SALPINGO OOPHERECTOMY Bilateral 07/29/2019   Procedure: ROBOTIC ASSISTED TOTAL HYSTERECTOMY WITH BILATERAL SALPINGO OOPHORECTOMY;  Surgeon: Everitt Amber, MD;  Location: [facility];  Service: Gynecology;  Laterality: Bilateral;  . SENTINEL NODE BIOPSY Bilateral 07/29/2019   Procedure: SENTINEL NODE BIOPSY;  Surgeon: Everitt Amber, MD;  Location: [facility];  Service: Gynecology;  Laterality: Bilateral;  . TUBAL LIGATION      Past Medical Hx:  Past Medical History:  Diagnosis Date  . Arthritis    both knees  . Cancer ([place])   . Family history of adverse reaction to anesthesia    hard to wake up  . Hypertension     Oncology Hx:  Oncology History  Endometrial ca [facility])  07/04/2019 Initial Diagnosis   Endometrial ca [facility])     Family Hx:   Family History  Problem Relation Age of Onset  . Hypertension Mother   . Diabetes Father   . Hypertension Father   . Lung cancer Cousin     Vitals:  There were no vitals taken for this visit.  Physical Exam: Deferred  I discussed the assessment and treatment plan with the patient. The patient was provided with an opportunity to ask questions and all were answered. The patient agreed with the plan and demonstrated an understanding of the instructions.   The patient was advised to call back or see an in-person evaluation if the symptoms worsen or if the condition fails to improve as anticipated.   I provided 10 minutes of non face-to-face telephone visit time during this encounter, and > 50% was spent counseling as documented under my assessment & plan.   Thereasa Solo, MD 08/07/2019, 5:30 PM

## 2019-08-18 ENCOUNTER — Ambulatory Visit: Payer: Self-pay | Attending: Nurse Practitioner | Admitting: Nurse Practitioner

## 2019-08-18 ENCOUNTER — Encounter: Payer: Self-pay | Admitting: Nurse Practitioner

## 2019-08-18 ENCOUNTER — Other Ambulatory Visit: Payer: Self-pay

## 2019-08-18 DIAGNOSIS — K219 Gastro-esophageal reflux disease without esophagitis: Secondary | ICD-10-CM

## 2019-08-18 DIAGNOSIS — I10 Essential (primary) hypertension: Secondary | ICD-10-CM

## 2019-08-18 MED ORDER — OMEPRAZOLE 20 MG PO CPDR: 20.0000 mg | DELAYED_RELEASE_CAPSULE | Freq: Two times a day (BID) | ORAL | 2 refills | Status: DC

## 2019-08-18 MED FILL — OMEPRAZOLE 20 MG CAP: 20 | 30 days supply | Qty: 60 | Fill #0

## 2019-08-18 NOTE — Progress Notes (Signed)
Virtual Visit via Telephone Note Due to national recommendations of social distancing due to [place] 19, telehealth visit is felt to be most appropriate for this patient at this time.  I discussed the limitations, risks, security and privacy concerns of performing an evaluation and management service by telephone and the availability of in person appointments. I also discussed with the patient that there may be a patient responsible charge related to this service. The patient expressed understanding and agreed to proceed.    I connected with Jerilee Hoh Tooley on 08/18/19  at   1:50 PM EDT  EDT by telephone and verified that I am speaking with the correct person using two identifiers.   Consent I discussed the limitations, risks, security and privacy concerns of performing an evaluation and management service by telephone and the availability of in person appointments. I also discussed with the patient that there may be a patient responsible charge related to this service. The patient expressed understanding and agreed to proceed.   Location of Patient: Private Residence   Location of Provider: [place] participating in Telemedicine visit: Geryl Rankins FNP-BC [place]    History of Present Illness: Telemedicine visit for: Establish care Uninsured and has not been under the care of a PCP in over 4 years.   has a past medical history of Arthritis, Endometrial cancer ([place]), Family history of adverse reaction to anesthesia, and Hypertension. S/P ROBOTIC ASSISTED TOTAL HYSTERECTOMY WITH BILATERAL SALPINGO OOPHORECTOMY 07-29-2019  Abdominal Pain: Patient complains of abdominal pain. The pain is described as aching, and is 8/10 in intensity. Pain is located in the LUQ without radiation. Onset was 2 months ago. Symptoms have been gradually worsening. Aggravating factors: fatty and fried foods.  Alleviating factors: NSAIDs. Associated  symptoms: belching and flatus. The patient denies constipation, diarrhea, hematochezia, melena and vomiting. She has to sleep upright to prevent GERD symptoms/reflux.  Has bowel movements every day. She has not seen a Copywriter, advertising for her symptoms.  She had been prescribed omeprazole after being evaluated in the ED for LUQ pain. However she only recently started taking this a few days ago.  She currently denies fever, chills, N/V.    Essential Hypertension  Not well controlled. Will restart amlodipine 10mg  daily. Denies chest pain, shortness of breath, palpitations, lightheadedness, dizziness, headaches or BLE edema. She is not monitoring her blood pressure at home.  BP Readings from Last 3 Encounters:  07/29/19 (!) 165/75  07/25/19 (!) 148/91  07/15/19 (!) 158/89    Past Medical History:  Diagnosis Date  . Arthritis    both knees  . Endometrial cancer ([place])   . Family history of adverse reaction to anesthesia    hard to wake up  . Hypertension     Past Surgical History:  Procedure Laterality Date  . ABDOMINAL HYSTERECTOMY    . HERNIA REPAIR    . ROBOTIC ASSISTED TOTAL HYSTERECTOMY WITH BILATERAL SALPINGO OOPHERECTOMY Bilateral 07/29/2019   Procedure: ROBOTIC ASSISTED TOTAL HYSTERECTOMY WITH BILATERAL SALPINGO OOPHORECTOMY;  Surgeon: Everitt Amber, MD;  Location: [facility];  Service: Gynecology;  Laterality: Bilateral;  . SENTINEL NODE BIOPSY Bilateral 07/29/2019   Procedure: SENTINEL NODE BIOPSY;  Surgeon: Everitt Amber, MD;  Location: [facility];  Service: Gynecology;  Laterality: Bilateral;  . TUBAL LIGATION      Family History  Problem Relation Age of Onset  . Hypertension Mother   . Diabetes Father   . Hypertension Father   .  Lung cancer Cousin     Social History   Socioeconomic History  . Marital status: Single    Spouse name: Not on file  . Number of children: Not on file  . Years of education: Not on file  . Highest education level: Not on file  Occupational History  .  Not on file  Social Needs  . Financial resource strain: Not on file  . Food insecurity    Worry: Not on file    Inability: Not on file  . Transportation needs    Medical: Not on file    Non-medical: Not on file  Tobacco Use  . Smoking status: Never Smoker  . Smokeless tobacco: Never Used  Substance and Sexual Activity  . Alcohol use: No  . Drug use: No  . Sexual activity: Not Currently  Lifestyle  . Physical activity    Days per week: Not on file    Minutes per session: Not on file  . Stress: Not on file  Relationships  . Social Herbalist on phone: Not on file    Gets together: Not on file    Attends religious service: Not on file    Active member of club or organization: Not on file    Attends meetings of clubs or organizations: Not on file    Relationship status: Not on file  Other Topics Concern  . Not on file  Social History Narrative  . Not on file     Observations/Objective: Awake, alert and oriented x 3   Review of Systems  Constitutional: Negative for fever, malaise/fatigue and weight loss.  HENT: Negative.  Negative for nosebleeds.   Eyes: Negative.  Negative for blurred vision, double vision and photophobia.  Respiratory: Negative.  Negative for cough and shortness of breath.   Cardiovascular: Negative.  Negative for chest pain, palpitations and leg swelling.  Gastrointestinal: Positive for abdominal pain and heartburn. Negative for nausea and vomiting.  Musculoskeletal: Positive for joint pain (bilateral knees). Negative for myalgias.  Neurological: Negative.  Negative for dizziness, focal weakness, seizures and headaches.  Psychiatric/Behavioral: Negative.  Negative for suicidal ideas.    Assessment and Plan:  Diagnoses and all orders for this visit:  Essential hypertension -     amLODipine (NORVASC) 10 MG tablet; Take 1 tablet (10 mg total) by mouth daily.  Continue all antihypertensives as prescribed.  Remember to bring in your blood  pressure log with you for your follow up appointment.  DASH/Mediterranean Diets are healthier choices for HTN.   Gastroesophageal reflux disease, unspecified whether esophagitis present -     omeprazole (PRILOSEC) 20 MG capsule; Take 1 capsule (20 mg total) by mouth 2 (two) times daily before a meal. INSTRUCTIONS: Avoid GERD Triggers: acidic, spicy or fried foods, caffeine, coffee, sodas,  alcohol and chocolate.      Follow Up Instructions Return in about 4 weeks (around 09/15/2019) for BP recheck.     I discussed the assessment and treatment plan with the patient. The patient was provided an opportunity to ask questions and all were answered. The patient agreed with the plan and demonstrated an understanding of the instructions.   The patient was advised to call back or seek an in-person evaluation if the symptoms worsen or if the condition fails to improve as anticipated.  I provided 24 minutes of non-face-to-face time during this encounter including median intraservice time, reviewing previous notes, labs, imaging, medications and explaining diagnosis and management.  Gildardo Pounds, FNP-BC

## 2019-08-19 ENCOUNTER — Encounter: Payer: Self-pay | Admitting: Nurse Practitioner

## 2019-08-19 MED ORDER — AMLODIPINE BESYLATE 10 MG PO TABS: 10.0000 mg | ORAL_TABLET | Freq: Every day | ORAL | 3 refills | Status: DC

## 2019-08-20 ENCOUNTER — Telehealth: Payer: Self-pay

## 2019-08-20 MED FILL — AMLODIPINE BESYLATE 10 MG T: 10 | 30 days supply | Qty: 30 | Fill #0

## 2019-08-20 NOTE — Telephone Encounter (Signed)
Attempt to call patient to inform on medication that was sent for her BP. No answer and LVM.

## 2019-08-20 NOTE — Telephone Encounter (Signed)
-----   Message from Gildardo Pounds, NP sent at 08/19/2019 11:26 PM EDT ----- Bryson Dames please let patient know I have sent her a blood pressure medication to the pharmacy. Will chck her blood pressure at her next office visit in a few weeks. THANKS!!

## 2019-08-22 ENCOUNTER — Other Ambulatory Visit: Payer: Self-pay

## 2019-08-22 ENCOUNTER — Inpatient Hospital Stay: Payer: Self-pay | Attending: Gynecologic Oncology | Admitting: Gynecologic Oncology

## 2019-08-22 VITALS — BP 163/90 | HR 68 | Temp 98.9°F | Resp 18 | Ht 66.5 in | Wt 218.6 lb

## 2019-08-22 DIAGNOSIS — C541 Malignant neoplasm of endometrium: Secondary | ICD-10-CM | POA: Insufficient documentation

## 2019-08-22 DIAGNOSIS — Z90722 Acquired absence of ovaries, bilateral: Secondary | ICD-10-CM | POA: Insufficient documentation

## 2019-08-22 DIAGNOSIS — Z90710 Acquired absence of both cervix and uterus: Secondary | ICD-10-CM | POA: Insufficient documentation

## 2019-08-22 DIAGNOSIS — Z7189 Other specified counseling: Secondary | ICD-10-CM

## 2019-08-22 NOTE — Progress Notes (Signed)
Gynecologic Oncology Follow-up  Chief Complaint:  Chief Complaint  Patient presents with  . endometrial cancer  . Post-op Follow-up   Assessment/Plan: 63 year old with a clinical stage I grade 1 endometrioid adenocarcinoma. MSI stable/MMR normal.    Pathology revealed low risk factors for recurrence, therefore no adjuvant therapy is recommended according to NCCN guidelines.  I discussed risk for recurrence and typical symptoms encouraged her to notify us of these should they develop between visits.  I recommend she have follow-up every 6 months for 5 years in accordance with NCCN guidelines. Those visits should include symptom assessment, physical exam and pelvic examination. Pap smears are not indicated or recommended in the routine surveillance of endometrial cancer.   HPI: Patient is seen today in consultation at the request of Dr. Hulan Fray. Patient is a 63 year old para 4 who had an ultrasound done in February 2000 secondary to left lower quadrant pain and it showed an endometrium of 7.7 mm as well as fibroids.  She then re-presented with a 1 year history of postmenopausal bleeding.  Therefore, an endometrial biopsy was performed August 21 with the results as below.  Endometrial biopsy 07/04/19: Diagnosis Endometrium, biopsy - ENDOMETRIOID CARCINOMA - SEE COMMENT Microscopic Comment Based on the biopsy the carcinoma appears FIGO grade 1.   Patient states that she has been menopausal since her late 27s.  She is never taken any hormone replacement therapy.  She states that about 2 years ago she started having some spotting and is progressively gotten heavier up to having some fairly large clots.  Now it is lighter since her biopsy.  She denies any cramping associated with the bleeding.  However, she states that for the past 3 to 4 weeks she is had some left-sided pain.  She thinks that it might of started after she picked up her mom who passed out from dehydration and they had to call EMS.   She has not tried any pain medication, heating pad etc. for this.  She states that her children told her she might of pulled a muscle.  She experiences this pain when she drinks water or swallows her saliva.  She is not really had any health care in the past 3 to 4 years.  She states she is never had a colonoscopy and her last mammogram was more than 4 years ago.  She does not have a primary care physician.  She does experience some nausea in the morning and it gets better after she eats.  She is very confused about allergies versus side effects.  She states that Advil causes her to get constipated but ibuprofen causes her to itch.  When I discussed that Advil and ibuprofen are essentially same medication she did not really know what to say and thinks that "it might be in my head".  She also states that she might be allergic to Tylenol but similarly could not really quantify what type of reaction she has.  Interval Hx:  On 07/29/19 she underwent a robotic assisted TLH, BSO and SLN biopsy. Surgery was uncomplicated. Final pathology revealed a stage IA grade 1 endometrial cancer with inner half myometrial invasion, no LVSI, Negative nodes/adnexa/cervix.  She was determined to have low risk factors and therefore no adjuvant therapy was recommended in accordance with NCCN guidelines. MMR normal/MSI stable.  Review of Systems: Constitutional: Denies fever, unintentional weight loss or weight gain. Skin: No rash Cardiovascular: No chest pain, shortness of breath, or edema  Pulmonary: No cough Gastro Intestinal: Reporting intermittent left-sided  abdominal soreness.  No nausea, vomiting, constipation, or diarrhea reported.  Genitourinary: No frequency, urgency, or dysuria.  No bleeding Musculoskeletal: No joint pain.  Psychology: No complaints  Current Meds:  Outpatient Encounter Medications as of 08/22/2019  Medication Sig  . amLODipine (NORVASC) 10 MG tablet Take 1 tablet (10 mg total) by mouth daily.   Marland Kitchen omeprazole (PRILOSEC) 20 MG capsule Take 1 capsule (20 mg total) by mouth 2 (two) times daily before a meal.  . ondansetron (ZOFRAN) 4 MG tablet Take 1 tablet (4 mg total) by mouth every 6 (six) hours.  . [DISCONTINUED] senna-docusate (SENOKOT-S) 8.6-50 MG tablet Take 2 tablets by mouth at bedtime. For AFTER surgery, do not take if having diarrhea (Patient not taking: Reported on 08/18/2019)  . [DISCONTINUED] traMADol (ULTRAM) 50 MG tablet Take 1 tablet (50 mg total) by mouth every 6 (six) hours as needed for severe pain. For AFTER surgery only. Do not take and drive. (Patient not taking: Reported on 08/18/2019)   No facility-administered encounter medications on file as of 08/22/2019.     Allergy:  Allergies  Allergen Reactions  . Penicillins Hives    Did it involve swelling of the face/tongue/throat, SOB, or low BP? No Did it involve sudden or severe rash/hives, skin peeling, or any reaction on the inside of your mouth or nose? No Did you need to seek medical attention at a hospital or doctor's office? No When did it last happen?10 years ago If all above answers are "NO", may proceed with cephalosporin use.   . Ibuprofen Itching  . Other     Egg plants  . Ace Inhibitors Cough  . Hydrochlorothiazide     REACTION: ? eyes turn yellow    Social Hx:   Social History   Socioeconomic History  . Marital status: Single    Spouse name: Not on file  . Number of children: Not on file  . Years of education: Not on file  . Highest education level: Not on file  Occupational History  . Not on file  Social Needs  . Financial resource strain: Not on file  . Food insecurity    Worry: Not on file    Inability: Not on file  . Transportation needs    Medical: Not on file    Non-medical: Not on file  Tobacco Use  . Smoking status: Never Smoker  . Smokeless tobacco: Never Used  Substance and Sexual Activity  . Alcohol use: No  . Drug use: No  . Sexual activity: Not Currently   Lifestyle  . Physical activity    Days per week: Not on file    Minutes per session: Not on file  . Stress: Not on file  Relationships  . Social Herbalist on phone: Not on file    Gets together: Not on file    Attends religious service: Not on file    Active member of club or organization: Not on file    Attends meetings of clubs or organizations: Not on file    Relationship status: Not on file  . Intimate partner violence    Fear of current or ex partner: Not on file    Emotionally abused: Not on file    Physically abused: Not on file    Forced sexual activity: Not on file  Other Topics Concern  . Not on file  Social History Narrative  . Not on file    Past Surgical Hx:  Past Surgical History:  Procedure Laterality Date  . ABDOMINAL HYSTERECTOMY    . HERNIA REPAIR    . ROBOTIC ASSISTED TOTAL HYSTERECTOMY WITH BILATERAL SALPINGO OOPHERECTOMY Bilateral 07/29/2019   Procedure: ROBOTIC ASSISTED TOTAL HYSTERECTOMY WITH BILATERAL SALPINGO OOPHORECTOMY;  Surgeon: Everitt Amber, MD;  Location: [facility];  Service: Gynecology;  Laterality: Bilateral;  . SENTINEL NODE BIOPSY Bilateral 07/29/2019   Procedure: SENTINEL NODE BIOPSY;  Surgeon: Everitt Amber, MD;  Location: [facility];  Service: Gynecology;  Laterality: Bilateral;  . TUBAL LIGATION      Past Medical Hx:  Past Medical History:  Diagnosis Date  . Arthritis    both knees  . Endometrial cancer ([place])   . Family history of adverse reaction to anesthesia    hard to wake up  . Hypertension     Oncology Hx:  Oncology History  Endometrial ca [facility])  07/04/2019 Initial Diagnosis   Endometrial ca [facility])     Family Hx:  Family History  Problem Relation Age of Onset  . Hypertension Mother   . Diabetes Father   . Hypertension Father   . Lung cancer Cousin     Vitals:  Blood pressure (!) 163/90, pulse 68, temperature 98.9 F (37.2 C), temperature source Temporal, resp. rate 18, height 5' 6.5" (1.689 m), weight 218 lb  9 oz (99.1 kg), SpO2 100 %.  Physical Exam: WD in NAD Neck  Supple NROM, without any enlargements.  Lymph Node Survey No cervical supraclavicular or inguinal adenopathy Cardiovascular  Pulse normal rate, regularity and rhythm. S1 and S2 normal.  Lungs  Clear to auscultation bilateraly, without wheezes/crackles/rhonchi. Good air movement.  Skin  No rash/lesions/breakdown  Psychiatry  Alert and oriented to person, place, and time  Abdomen  Normoactive bowel sounds, abdomen soft, non-tender and obese without evidence of hernia. Well healed incisions Back No CVA tenderness Genito Urinary  Vulva/vagina: Normal external female genitalia.   No lesions. No discharge or bleeding.  Bladder/urethra:  No lesions or masses, well supported bladder  Vagina: well healed cuff, no lesions, no blood.   Cervix and uterus surgically absent.  Adnexa: no palpable masses. Rectal  deferred Extremities  No bilateral cyanosis, clubbing or edema.   15 minutes of direct face to face counseling time was spent with the patient. This included discussion about prognosis, therapy recommendations and postoperative side effects and are beyond the scope of routine postoperative care.   Thereasa Solo, MD 08/22/2019, 1:41 PM

## 2019-08-22 NOTE — Patient Instructions (Addendum)
Your cancer was a stage 1 cancer with low risk factors and therefore no treatment is recommended beyond surgery.  However 6 monthly checks are recommended.  Please notify Dr Denman George at phone number [phone] if you notice vaginal bleeding, new pelvic or abdominal pains, bloating, feeling full easy, or a change in bladder or bowel function.   Please contact Dr Serita Grit office (at [phone]) in January to request an appointment with her for April, 2021.

## 2019-09-01 ENCOUNTER — Other Ambulatory Visit: Payer: Self-pay

## 2019-09-01 ENCOUNTER — Ambulatory Visit: Payer: Self-pay | Attending: Nurse Practitioner | Admitting: Nurse Practitioner

## 2019-09-01 ENCOUNTER — Encounter: Payer: Self-pay | Admitting: Nurse Practitioner

## 2019-09-01 DIAGNOSIS — T50995D Adverse effect of other drugs, medicaments and biological substances, subsequent encounter: Secondary | ICD-10-CM

## 2019-09-01 DIAGNOSIS — R110 Nausea: Secondary | ICD-10-CM

## 2019-09-01 DIAGNOSIS — T50905D Adverse effect of unspecified drugs, medicaments and biological substances, subsequent encounter: Secondary | ICD-10-CM

## 2019-09-01 DIAGNOSIS — R42 Dizziness and giddiness: Secondary | ICD-10-CM

## 2019-09-01 NOTE — Progress Notes (Signed)
Virtual Visit via Telephone Note Due to national recommendations of social distancing due to [place], telehealth visit is felt to be most appropriate for this patient at this time.  I discussed the limitations, risks, security and privacy concerns of performing an evaluation and management service by telephone and the availability of in person appointments. I also discussed with the patient that there may be a patient responsible charge related to this service. The patient expressed understanding and agreed to proceed.    I connected with Hannah Decker on 09/01/19  at   2:10 PM EDT  EDT by telephone and verified that I am speaking with the correct person using two identifiers.   Consent I discussed the limitations, risks, security and privacy concerns of performing an evaluation and management service by telephone and the availability of in person appointments. I also discussed with the patient that there may be a patient responsible charge related to this service. The patient expressed understanding and agreed to proceed.   Location of Patient: Private Residence   Location of Provider: [place] participating in Telemedicine visit: Geryl Rankins FNP-BC H[place]    History of Present Illness: Telemedicine visit for: Medication Reaction  States every time she takes omeprazole she feels nauseous and it makes her dizzy. I have instructed her to stop taking it and she can take ranitidine 150 mg BID OTC instead. She denies any vomiting and states her blood pressure readings at home have not been elevated. She was instructed to contact me via mychart if she feels the ranitidine is not working for her.  She states she is not taking any new medications and the symptoms only occur with omeprazole.   Past Medical History:  Diagnosis Date  . Arthritis    both knees  . Endometrial cancer ([place])   . Family history of adverse reaction  to anesthesia    hard to wake up  . Hypertension     Past Surgical History:  Procedure Laterality Date  . ABDOMINAL HYSTERECTOMY    . HERNIA REPAIR    . ROBOTIC ASSISTED TOTAL HYSTERECTOMY WITH BILATERAL SALPINGO OOPHERECTOMY Bilateral 07/29/2019   Procedure: ROBOTIC ASSISTED TOTAL HYSTERECTOMY WITH BILATERAL SALPINGO OOPHORECTOMY;  Surgeon: Everitt Amber, MD;  [facility];  Service: Gynecology;  Laterality: Bilateral;  . SENTINEL NODE BIOPSY Bilateral 07/29/2019   Procedure: SENTINEL NODE BIOPSY;  Surgeon: Everitt Amber, MD;  [facility];  Service: Gynecology;  Laterality: Bilateral;  . TUBAL LIGATION      Family History  Problem Relation Age of Onset  . Hypertension Mother   . Diabetes Father   . Hypertension Father   . Lung cancer Cousin     Social History   Socioeconomic History  . Marital status: Single    Spouse name: Not on file  . Number of children: Not on file  . Years of education: Not on file  . Highest education level: Not on file  Occupational History  . Not on file  Social Needs  . Financial resource strain: Not on file  . Food insecurity    Worry: Not on file    Inability: Not on file  . Transportation needs    Medical: Not on file    Non-medical: Not on file  Tobacco Use  . Smoking status: Never Smoker  . Smokeless tobacco: Never Used  Substance and Sexual Activity  . Alcohol use: No  . Drug use:  No  . Sexual activity: Not Currently  Lifestyle  . Physical activity    Days per week: Not on file    Minutes per session: Not on file  . Stress: Not on file  Relationships  . Social Herbalist on phone: Not on file    Gets together: Not on file    Attends religious service: Not on file    Active member of club or organization: Not on file    Attends meetings of clubs or organizations: Not on file    Relationship status: Not on file  Other Topics Concern  . Not on file  Social History Narrative  . Not on file      Observations/Objective: Awake, alert and oriented x 3   Review of Systems  Constitutional: Negative for fever, malaise/fatigue and weight loss.  HENT: Negative.  Negative for nosebleeds.   Eyes: Negative.  Negative for blurred vision, double vision and photophobia.  Respiratory: Negative.  Negative for cough and shortness of breath.   Cardiovascular: Negative.  Negative for chest pain, palpitations and leg swelling.  Gastrointestinal: Positive for heartburn and nausea. Negative for abdominal pain, blood in stool, constipation, diarrhea, melena and vomiting.  Musculoskeletal: Negative.  Negative for myalgias.  Neurological: Positive for dizziness. Negative for focal weakness, seizures and headaches.  Psychiatric/Behavioral: Negative.  Negative for suicidal ideas.    Assessment and Plan: Hannah Decker was seen today for medication reaction.  Diagnoses and all orders for this visit:  Adverse effect of drug, subsequent encounter     Follow Up Instructions Return if symptoms worsen or fail to improve.     I discussed the assessment and treatment plan with the patient. The patient was provided an opportunity to ask questions and all were answered. The patient agreed with the plan and demonstrated an understanding of the instructions.   The patient was advised to call back or seek an in-person evaluation if the symptoms worsen or if the condition fails to improve as anticipated.  I provided 12 minutes of non-face-to-face time during this encounter including median intraservice time, reviewing previous notes, labs, imaging, medications and explaining diagnosis and management.  Gildardo Pounds, FNP-BC

## 2019-09-15 ENCOUNTER — Ambulatory Visit: Payer: Self-pay | Admitting: Nurse Practitioner

## 2019-10-13 ENCOUNTER — Ambulatory Visit: Payer: Self-pay | Admitting: Nurse Practitioner

## 2019-11-19 ENCOUNTER — Ambulatory Visit: Payer: Self-pay | Attending: Family Medicine | Admitting: Physician Assistant

## 2019-11-19 ENCOUNTER — Other Ambulatory Visit: Payer: Self-pay

## 2019-11-19 VITALS — BP 153/87 | HR 88 | Temp 98.8°F | Resp 16 | Ht 66.5 in | Wt 190.2 lb

## 2019-11-19 DIAGNOSIS — R822 Biliuria: Secondary | ICD-10-CM

## 2019-11-19 DIAGNOSIS — R109 Unspecified abdominal pain: Secondary | ICD-10-CM

## 2019-11-19 DIAGNOSIS — R198 Other specified symptoms and signs involving the digestive system and abdomen: Secondary | ICD-10-CM

## 2019-11-19 DIAGNOSIS — R630 Anorexia: Secondary | ICD-10-CM

## 2019-11-19 DIAGNOSIS — R1012 Left upper quadrant pain: Secondary | ICD-10-CM

## 2019-11-19 LAB — POCT URINALYSIS DIP (CLINITEK)
Glucose, UA: NEGATIVE mg/dL
Nitrite, UA: NEGATIVE
POC PROTEIN,UA: 30 — AB
Spec Grav, UA: 1.025 (ref 1.010–1.025)
Urobilinogen, UA: 0.2 E.U./dL
pH, UA: 5.5 (ref 5.0–8.0)

## 2019-11-19 NOTE — Progress Notes (Signed)
GI sx's progressing over the last 2-3 mos.  "feels like I am dying"  Pain LLQ and occasionally cramping in the right flank.

## 2019-11-19 NOTE — Patient Instructions (Signed)
Make appt with gynecology/oncology

## 2019-11-19 NOTE — Progress Notes (Signed)
Hannah Decker, is a 64 y.o. female  RXV:[id number]  JKD:[id number]  DOB - August 15, 1956  Subjective:  Chief Complaint and HPI: Hannah Decker is a 64 y.o. female here today with LUQ pain for several months that has worsened.  H/o totally hyst s/p endometrial CA(07/29/2019).  (Final pathology revealed a stage IA grade 1 endometrial cancer with inner half myometrial invasion, no LVSI, Negative nodes/adnexa/cervix.  She was determined to have low risk factors and therefore no adjuvant therapy was recommended in accordance with NCCN guidelines. MMR normal/MSI stable.)  She states that the pain she is having now has been present since about august(prior to her surgery).  She has never had colonoscopy.  She has had a change in BM and she is more constipated than usual.  Doesn't drink water bc it hurts her stomach.  Denies urinary s/sx.  Last saw gyn ocn 08/2019.  No melena or hematochezia.  Pain feels like burning and cramping.  zofran and prilosec dont' help and actually hurt her stomach.     ROS:   Constitutional:  No f/c, No night sweats, No unexplained weight loss. EENT:  No vision changes, No blurry vision, No hearing changes. No mouth, throat, or ear problems.  Respiratory: No cough, No SOB Cardiac: No CP, no palpitations GI:  + abd pain, No N/V/D. GU: No Urinary s/sx Musculoskeletal: No joint pain Neuro: No headache, no dizziness, no motor weakness.  Skin: No rash Endocrine:  No polydipsia. No polyuria.  Psych: Denies SI/HI  No problems updated.  ALLERGIES: Allergies  Allergen Reactions  . Penicillins Hives    Did it involve swelling of the face/tongue/throat, SOB, or low BP? No Did it involve sudden or severe rash/hives, skin peeling, or any reaction on the inside of your mouth or nose? No Did you need to seek medical attention at a hospital or doctor's office? No When did it last happen?10 years ago If all above answers are "NO", may proceed with cephalosporin use.   .  Ibuprofen Itching  . Other     Egg plants  . Ace Inhibitors Cough  . Hydrochlorothiazide     REACTION: ? eyes turn yellow    PAST MEDICAL HISTORY: Past Medical History:  Diagnosis Date  . Arthritis    both knees  . Endometrial cancer ([place])   . Family history of adverse reaction to anesthesia    hard to wake up  . Hypertension     MEDICATIONS AT HOME: Prior to Admission medications   Not on File     Objective:  EXAM:   Vitals:   11/19/19 1026 11/19/19 1035  BP:  (!) 153/87  Pulse:  88  Resp:  16  Temp:  98.8 F (37.1 C)  TempSrc:  Oral  SpO2:  99%  Weight: 190 lb 3.2 oz (86.3 kg)   Height: 5' 6.5" (1.689 m)     General appearance : A&OX3. NAD. Non-toxic-appearing, winces in pain occasionally.  Appears uncomfortable.  HEENT: Atraumatic and Normocephalic.  PERRLA. EOM intact.  Neck: supple, no JVD. No cervical lymphadenopathy. No thyromegaly Chest/Lungs:  Breathing-non-labored, Good air entry bilaterally, breath sounds normal without rales, rhonchi, or wheezing  CVS: S1 S2 regular, no murmurs, gallops, rubs  Abdomen: Bowel sounds present, Non tender and not distended with no gaurding, rigidity or rebound.  There is a firmer area in the mid lower abdomen.   Extremities: Bilateral Lower Ext shows no edema, both legs are warm to touch with = pulse throughout Neurology:  CN  II-XII grossly intact, Non focal.   Psych:  TP linear. J/I WNL. Normal speech. Appropriate eye contact and affect.  Skin:  No Rash  Data Review No results found for: HGBA1C   Assessment & Plan   1. Stomach pain Will start work up.  Fairly concerning due to recent CA.  Non-acute abdomen today.  Drink more water.   - POCT URINALYSIS DIP (CLINITEK) - Ambulatory referral to Gastroenterology - Comprehensive metabolic panel - CBC with Differential -make appt with gyn onc.  Patient agrees  2. Change in bowel movement - Ambulatory referral to Gastroenterology  3. Poor appetite Frequent small  meals and adequate water intake.  4. Bilirubinuria Increase water intake - Comprehensive metabolic panel  5. Left upper quadrant abdominal pain - CBC with Differential - Lipase - CT Abdomen Pelvis W Contrast; Future - H. pylori breath test  To ED/call 911 if sever.  Patient verbalizes understanding.    Patient have been counseled extensively about nutrition and exercise  Return in about 1 month (around 12/20/2019) for with PCP.  The patient was given clear instructions to go to ER or return to medical center if symptoms don't improve, worsen or new problems develop. The patient verbalized understanding. The patient was told to call to get lab results if they haven't heard anything in the next week.     Freeman Caldron, PA-C [facility] [place], [place]   11/19/2019, 12:56 PMPatient ID: Hannah Decker, female   DOB: 1956-03-17, 64 y.o.   MRN: [id number]

## 2019-11-20 LAB — COMPREHENSIVE METABOLIC PANEL
ALT: 21 IU/L (ref 0–32)
AST: 53 IU/L — ABNORMAL HIGH (ref 0–40)
Albumin/Globulin Ratio: 1.2 (ref 1.2–2.2)
Albumin: 4.1 g/dL (ref 3.8–4.8)
Alkaline Phosphatase: 130 IU/L — ABNORMAL HIGH (ref 39–117)
BUN/Creatinine Ratio: 9 — ABNORMAL LOW (ref 12–28)
BUN: 10 mg/dL (ref 8–27)
Bilirubin Total: 0.6 mg/dL (ref 0.0–1.2)
CO2: 26 mmol/L (ref 20–29)
Calcium: 10 mg/dL (ref 8.7–10.3)
Chloride: 101 mmol/L (ref 96–106)
Creatinine, Ser: 1.12 mg/dL — ABNORMAL HIGH (ref 0.57–1.00)
GFR calc Af Amer: 60 mL/min/{1.73_m2} (ref >59)
GFR calc non Af Amer: 52 mL/min/{1.73_m2} — ABNORMAL LOW (ref >59)
Globulin, Total: 3.4 g/dL (ref 1.5–4.5)
Glucose: 102 mg/dL — ABNORMAL HIGH (ref 65–99)
Potassium: 4 mmol/L (ref 3.5–5.2)
Sodium: 140 mmol/L (ref 134–144)
Total Protein: 7.5 g/dL (ref 6.0–8.5)

## 2019-11-20 LAB — CBC WITH DIFFERENTIAL/PLATELET
Basophils Absolute: 0 10*3/uL (ref 0.0–0.2)
Basos: 1 %
EOS (ABSOLUTE): 0 10*3/uL (ref 0.0–0.4)
Eos: 0 %
Hematocrit: 41.1 % (ref 34.0–46.6)
Hemoglobin: 13.5 g/dL (ref 11.1–15.9)
Immature Grans (Abs): 0 10*3/uL (ref 0.0–0.1)
Immature Granulocytes: 0 %
Lymphocytes Absolute: 1 10*3/uL (ref 0.7–3.1)
Lymphs: 14 %
MCH: 25.7 pg — ABNORMAL LOW (ref 26.6–33.0)
MCHC: 32.8 g/dL (ref 31.5–35.7)
MCV: 78 fL — ABNORMAL LOW (ref 79–97)
Monocytes Absolute: 0.9 10*3/uL (ref 0.1–0.9)
Monocytes: 12 %
Neutrophils Absolute: 5.2 10*3/uL (ref 1.4–7.0)
Neutrophils: 73 %
Platelets: 272 10*3/uL (ref 150–450)
RBC: 5.26 x10E6/uL (ref 3.77–5.28)
RDW: 15.8 % — ABNORMAL HIGH (ref 11.7–15.4)
WBC: 7.2 10*3/uL (ref 3.4–10.8)

## 2019-11-20 LAB — H. PYLORI BREATH TEST: H pylori Breath Test: NEGATIVE

## 2019-11-20 LAB — LIPASE: Lipase: 26 U/L (ref 14–72)

## 2019-11-21 ENCOUNTER — Telehealth: Payer: Self-pay | Admitting: *Deleted

## 2019-11-21 ENCOUNTER — Encounter: Payer: Self-pay | Admitting: *Deleted

## 2019-11-21 NOTE — Telephone Encounter (Signed)
-----   Message from Argentina Donovan, [place] sent at 11/21/2019  8:14 AM EST ----- Please call patient.  Stomach ulcer test is negative.  Follow-up as planned.  Thanks, Freeman Caldron, PA-C

## 2019-11-21 NOTE — Telephone Encounter (Signed)
Pt name and DOB verified. Patient aware of results and result note per Freeman Caldron, PA-C.   Patient is requesting medication to help with pain. Ok to send to [facility] pharmacy or [facility].   Please advise.

## 2019-11-24 ENCOUNTER — Other Ambulatory Visit: Payer: Self-pay | Admitting: Physician Assistant

## 2019-11-24 MED ORDER — RANITIDINE HCL 300 MG PO TABS: 300.0000 mg | ORAL_TABLET | Freq: Every day | ORAL | 2 refills | Status: AC

## 2019-11-24 NOTE — Telephone Encounter (Signed)
I know she has tried a couple of things which caused her stomach to hurt more.  I sen her prescription strength Zantac which she has not been tried on.  Thanks, Freeman Caldron, PA-C

## 2019-11-26 ENCOUNTER — Ambulatory Visit (HOSPITAL_COMMUNITY)
Admission: RE | Admit: 2019-11-26 | Discharge: 2019-11-26 | Disposition: A | Discharge status: Home / Self Care | Payer: Self-pay | Source: Ambulatory Visit | Attending: Physician Assistant | Admitting: Physician Assistant

## 2019-11-26 ENCOUNTER — Other Ambulatory Visit: Payer: Self-pay

## 2019-11-26 DIAGNOSIS — R1012 Left upper quadrant pain: Secondary | ICD-10-CM | POA: Insufficient documentation

## 2019-11-26 MED ORDER — IOHEXOL 300 MG/ML  SOLN
100.0000 mL | Freq: Once | INTRAMUSCULAR | Status: AC | PRN
  Administered 2019-11-26: 100 mL via INTRAVENOUS

## 2019-11-27 ENCOUNTER — Other Ambulatory Visit: Payer: Self-pay | Admitting: Physician Assistant

## 2019-11-27 ENCOUNTER — Telehealth: Payer: Self-pay | Admitting: Nurse Practitioner

## 2019-11-27 DIAGNOSIS — K769 Liver disease, unspecified: Secondary | ICD-10-CM

## 2019-11-27 DIAGNOSIS — D490 Neoplasm of unspecified behavior of digestive system: Secondary | ICD-10-CM

## 2019-11-27 NOTE — Telephone Encounter (Signed)
Unable to leave VM. Mailbox is full

## 2019-11-27 NOTE — Telephone Encounter (Signed)
FYI-I spoke with Dr Mickey Farber and have attempted to call the patient twice.  The mailbox is full. Freeman Caldron, PA-C

## 2019-11-27 NOTE — Telephone Encounter (Signed)
Valarie Merino from [facility] called requesting to speak with [facility] regarding CT order for patient, per Dr. Real Cons.   Call back number [phone].

## 2019-11-28 ENCOUNTER — Ambulatory Visit: Payer: Self-pay | Admitting: Gastroenterology

## 2019-11-28 ENCOUNTER — Telehealth: Payer: Self-pay | Admitting: Nurse Practitioner

## 2019-11-28 NOTE — Telephone Encounter (Signed)
Patient called requesting results. Patient was informed of results verbalized understanding and had no further questions.

## 2019-11-28 NOTE — Telephone Encounter (Signed)
Received a new patient appt for pancreatic lesion/liver lesion. A new pt appt has been scheduled for Hannah Decker to see Lacie on 1/18 at 1:45pm. I was unable to reach the pt. I sent a msg to the referring office to try to notify the pt of the appt.

## 2019-12-01 ENCOUNTER — Other Ambulatory Visit: Payer: Self-pay

## 2019-12-01 ENCOUNTER — Inpatient Hospital Stay: Payer: Self-pay | Attending: Nurse Practitioner | Admitting: Nurse Practitioner

## 2019-12-01 ENCOUNTER — Telehealth: Payer: Self-pay

## 2019-12-01 VITALS — BP 139/86 | HR 88 | Temp 98.2°F | Resp 18 | Ht 66.5 in | Wt 188.5 lb

## 2019-12-01 DIAGNOSIS — Z90710 Acquired absence of both cervix and uterus: Secondary | ICD-10-CM | POA: Insufficient documentation

## 2019-12-01 DIAGNOSIS — R6881 Early satiety: Secondary | ICD-10-CM | POA: Insufficient documentation

## 2019-12-01 DIAGNOSIS — I10 Essential (primary) hypertension: Secondary | ICD-10-CM | POA: Insufficient documentation

## 2019-12-01 DIAGNOSIS — K219 Gastro-esophageal reflux disease without esophagitis: Secondary | ICD-10-CM | POA: Insufficient documentation

## 2019-12-01 DIAGNOSIS — R112 Nausea with vomiting, unspecified: Secondary | ICD-10-CM | POA: Insufficient documentation

## 2019-12-01 DIAGNOSIS — C541 Malignant neoplasm of endometrium: Secondary | ICD-10-CM | POA: Insufficient documentation

## 2019-12-01 DIAGNOSIS — K5900 Constipation, unspecified: Secondary | ICD-10-CM | POA: Insufficient documentation

## 2019-12-01 DIAGNOSIS — D378 Neoplasm of uncertain behavior of other specified digestive organs: Secondary | ICD-10-CM | POA: Insufficient documentation

## 2019-12-01 DIAGNOSIS — R634 Abnormal weight loss: Secondary | ICD-10-CM | POA: Insufficient documentation

## 2019-12-01 DIAGNOSIS — K769 Liver disease, unspecified: Secondary | ICD-10-CM | POA: Insufficient documentation

## 2019-12-01 DIAGNOSIS — Z79899 Other long term (current) drug therapy: Secondary | ICD-10-CM | POA: Insufficient documentation

## 2019-12-01 DIAGNOSIS — Z90722 Acquired absence of ovaries, bilateral: Secondary | ICD-10-CM | POA: Insufficient documentation

## 2019-12-01 DIAGNOSIS — R1012 Left upper quadrant pain: Secondary | ICD-10-CM | POA: Insufficient documentation

## 2019-12-01 DIAGNOSIS — K8689 Other specified diseases of pancreas: Secondary | ICD-10-CM

## 2019-12-01 DIAGNOSIS — R194 Change in bowel habit: Secondary | ICD-10-CM | POA: Insufficient documentation

## 2019-12-01 MED ORDER — TRAMADOL HCL 50 MG PO TABS: 50.0000 mg | ORAL_TABLET | Freq: Three times a day (TID) | ORAL | 0 refills | Status: AC | PRN

## 2019-12-01 MED ORDER — ONDANSETRON 4 MG PO TBDP: 4.0000 mg | ORAL_TABLET | Freq: Three times a day (TID) | ORAL | 0 refills | Status: AC | PRN

## 2019-12-01 NOTE — Telephone Encounter (Signed)
-----   Message from Argentina Donovan, [place] sent at 11/27/2019 11:26 AM EST ----- Please call patient (I tried calling twice and her mailbox was full).  I spoke with the radiologist.  Her CT scan shows a mass on the pancreas and possible lesions on the liver.  I have placed an urgent referral to oncology so this can be further assessed.  This is most likely what is causing her symptoms. The oncologist will better be able to answer questions and advise what treatments are available/need to be done.  Thanks, Freeman Caldron, PA-C

## 2019-12-01 NOTE — Telephone Encounter (Signed)
Attempted to reach patient her voice mailbox is full.  Called and spoke with her daughter Hannah Decker informing her patient has appointment here this afternoon at 1:45 to arrive at least 20 minutes early for registration.  She states she will ride over to her mother's house and let her know.

## 2019-12-01 NOTE — Progress Notes (Addendum)
[place]  [phone] Fax:([phone] [place] Note   Patient Care Team: Gildardo Pounds, NP as PCP - General (Nurse Practitioner)  Date of Service: 12/01/2019  CHIEF COMPLAINTS/PURPOSE OF CONSULTATION:  Pancreatic mass, referred by Freeman Caldron, PA-C  HISTORY OF PRESENTING ILLNESS:  Hannah Decker 64 y.o. female with h/o stage I grade 1 endometrioid adenocarcinoma in 07/2019 is here because of pancreas mass, highly concerning for primary pancreatic malignancy. She developed progressive LUQ abdominal pain, decreased appetite, nausea, and postmenopausal bleeding in 05/2019. Endometrial biopsy showed endometrioid carcinoma. She was seen by gyn onc Dr. Alycia Rossetti at [facility] and ultimately underwent total hysterectomy with BSO and SLN biopsy on 07/29/19 by Dr. Denman George. Path showed FIGO grade 1 endometrioid carcinoma, pT1aN0, she did not require adjuvant treatment. LUQ pain persisted, began radiating to her back, and recently started in RUQ. She was treated for GERD. She was ultimately referred for CT AP W contrast on 11/26/19 that showed a solid mass in the pancreatic tail measuring 8.8 x 5.7 x 5.1 cm with possible local extension along the lesser sac, abutting the margin of the stomach, with suspected direct invasion of the left adrenal gland.  Mass appears to abut the upper half of the left renal vein and the left side of the common hepatic artery.  No definite involvement of the SMA.  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  The left periaortic lymph node below the renal vein measures 1.2 cm, concerning for nodal metastasis.  There are also numerous >20 enhancing liver lesions highly concerning for metastatic disease.  A biopsy has not been obtained.  She was referred to [place] for further evaluation and management.  Past medical history is significant for HTN and benign follicular thyroid nodule.  She does not take routine prescribed medication.  Socially, she is divorced.  G4P4, 2 daughters 2 sons and many grandchildren. She is not working, takes care of her mother who is 16 with dementia. 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 She was active and walked for exercise before symptom onset. She drives, although prefers not to, and is independent of ADLs. She denies tobacco, alcohol, or drug use. She had cousins who had cancer, thinks one had gyn cancer. Denies family history of breast, colon, or pancreas cancer. She has never had colonoscopy, last mammogram >5 years ago.   Today she presents alone. She has severe LUQ pain 8 to 9/10 that radiates to back and more recently started on RUQ. Ibuprofen "takes the edge off" but doesn't resolve pain. She takes it with benadryl due to itching. She has intermittent nausea with vomiting x1. Appetite is low with early satiety. She lost approx 40 lbs since pain onset in 05/2019, most of which occurred since 08/2019. She has constipation, last BM in 4 days. Has not tried meds yet. She is fatigued but functional and out of bed much of the day, daughter helps her. Denies fever, chills, cough, chest pain, dyspnea, or leg edema.   MEDICAL HISTORY:  Past Medical History:  Diagnosis Date  . Arthritis    both knees  . Bilirubinuria   . Endometrial cancer ([place])   . Family history of adverse reaction to anesthesia    hard to wake up  . GERD (gastroesophageal reflux disease)   . Hypertension     SURGICAL HISTORY: Past Surgical History:  Procedure Laterality Date  . ABDOMINAL HYSTERECTOMY    . HERNIA REPAIR    .  ROBOTIC ASSISTED TOTAL HYSTERECTOMY WITH BILATERAL SALPINGO OOPHERECTOMY Bilateral 07/29/2019   Procedure: ROBOTIC ASSISTED TOTAL HYSTERECTOMY WITH BILATERAL SALPINGO OOPHORECTOMY;  Surgeon: Everitt Amber, MD;  Location: [facility];  Service: Gynecology;  Laterality: Bilateral;  . SENTINEL NODE BIOPSY Bilateral 07/29/2019   Procedure: SENTINEL NODE BIOPSY;  Surgeon: Everitt Amber, MD;  Location: [facility]  ORS;  Service: Gynecology;  Laterality: Bilateral;  . TUBAL LIGATION      SOCIAL HISTORY: Social History   Socioeconomic History  . Marital status: Divorced    Spouse name: Not on file  . Number of children: Not on file  . Years of education: Not on file  . Highest education level: Not on file  Occupational History  . Not on file  Tobacco Use  . Smoking status: Never Smoker  . Smokeless tobacco: Never Used  Substance and Sexual Activity  . Alcohol use: No  . Drug use: No  . Sexual activity: Not Currently  Other Topics Concern  . Not on file  Social History Narrative  . Not on file   Social Determinants of Health   Financial Resource Strain:   . Difficulty of Paying Living Expenses: Not on file  Food Insecurity:   . Worried About Charity fundraiser in the Last Year: Not on file  . Ran Out of Food in the Last Year: Not on file  Transportation Needs:   . Lack of Transportation (Medical): Not on file  . Lack of Transportation (Non-Medical): Not on file  Physical Activity:   . Days of Exercise per Week: Not on file  . Minutes of Exercise per Session: Not on file  Stress:   . Feeling of Stress : Not on file  Social Connections:   . Frequency of Communication with Friends and Family: Not on file  . Frequency of Social Gatherings with Friends and Family: Not on file  . Attends Religious Services: Not on file  . Active Member of Clubs or Organizations: Not on file  . Attends Archivist Meetings: Not on file  . Marital Status: Not on file  Intimate Partner Violence:   . Fear of Current or Ex-Partner: Not on file  . Emotionally Abused: Not on file  . Physically Abused: Not on file  . Sexually Abused: Not on file    FAMILY HISTORY: Family History  Problem Relation Age of Onset  . Hypertension Mother   . Diabetes Father   . Hypertension Father   . Lung cancer Cousin     ALLERGIES:  is allergic to penicillins; ibuprofen; other; ace inhibitors; and  hydrochlorothiazide.  MEDICATIONS:  Current Outpatient Medications  Medication Sig Dispense Refill  . ondansetron (ZOFRAN-ODT) 4 MG disintegrating tablet Take 1 tablet (4 mg total) by mouth every 8 (eight) hours as needed for nausea or vomiting. 30 tablet 0  . ranitidine (ZANTAC) 300 MG tablet Take 1 tablet (300 mg total) by mouth at bedtime. (Patient not taking: Reported on 12/01/2019) 30 tablet 2  . traMADol (ULTRAM) 50 MG tablet Take 1 tablet (50 mg total) by mouth every 8 (eight) hours as needed for severe pain. 30 tablet 0   No current facility-administered medications for this visit.    REVIEW OF SYSTEMS:   Constitutional: Denies fevers, chills or abnormal night sweats (+) fatigue (+) weight loss Eyes: Denies blurriness of vision, double vision or watery eyes Ears, nose, mouth, throat, and face: Denies mucositis or sore throat Respiratory: Denies cough, dyspnea or wheezes Cardiovascular: Denies palpitation, chest discomfort or  lower extremity swelling Gastrointestinal:  Denies  heartburn  (+) n/v (+) constipation (+) abdominal pain (+) early satiety  Skin: Denies abnormal skin rashes Lymphatics: Denies new lymphadenopathy or easy bruising Neurological:Denies numbness, tingling or new weaknesses Behavioral/Psych: Mood is stable, no new changes  All other systems were reviewed with the patient and are negative.  PHYSICAL EXAMINATION: ECOG PERFORMANCE STATUS: 1 - Symptomatic but completely ambulatory  Vitals:   12/01/19 1406  BP: 139/86  Pulse: 88  Resp: 18  Temp: 98.2 F (36.8 C)  SpO2: 100%   Filed Weights   12/01/19 1406  Weight: 188 lb 8 oz (85.5 kg)    GENERAL:alert, no distress and comfortable SKIN: no rash  EYES:  sclera clear NECK: enlarged thyroid LYMPH:  no palpable cervical or supraclavicular lymphadenopathy LUNGS: clear with normal breathing effort HEART: regular rate & rhythm, no lower extremity edema ABDOMEN:abdomen soft, non-tender decreased bowel  sounds. Epigastric fullness. No hepatomegaly  Musculoskeletal:no cyanosis of digits and no clubbing  PSYCH: alert & oriented x 3 with fluent speech NEURO: no focal motor/sensory deficits  LABORATORY DATA:  I have reviewed the data as listed CBC Latest Ref Rng & Units 11/19/2019 07/25/2019 11/09/2011  WBC 3.4 - 10.8 x10E3/uL 7.2 6.5 5.2  Hemoglobin 11.1 - 15.9 g/dL 13.5 14.0 13.7  Hematocrit 34.0 - 46.6 % 41.1 44.8 42.3  Platelets 150 - 450 x10E3/uL 272 276 274   CMP Latest Ref Rng & Units 11/19/2019 07/25/2019 11/09/2011  Glucose 65 - 99 mg/dL 102(H) 96 85  BUN 8 - 27 mg/dL 10 19 14   Creatinine 0.57 - 1.00 mg/dL 1.12(H) 1.05(H) 0.95  Sodium 134 - 144 mmol/L 140 143 142  Potassium 3.5 - 5.2 mmol/L 4.0 3.4(L) 4.5  Chloride 96 - 106 mmol/L 101 109 107  CO2 20 - 29 mmol/L 26 27 28   Calcium 8.7 - 10.3 mg/dL 10.0 9.1 9.5  Total Protein 6.0 - 8.5 g/dL 7.5 8.2(H) -  Total Bilirubin 0.0 - 1.2 mg/dL 0.6 0.6 -  Alkaline Phos 39 - 117 IU/L 130(H) 81 -  AST 0 - 40 IU/L 53(H) 16 -  ALT 0 - 32 IU/L 21 16 -    RADIOGRAPHIC STUDIES: I have personally reviewed the radiological images as listed and agreed with the findings in the report. CT Abdomen Pelvis W Contrast  Addendum Date: 11/27/2019   ADDENDUM REPORT: 11/27/2019 11:18 ADDENDUM: The original report was by Dr. Van Clines. The following addendum is by Dr. Van Clines: These results were called by telephone on 11/27/2019 at 11:17 am to provider Dr. Freeman Caldron , who verbally acknowledged these results. Electronically Signed   By: Van Clines M.D.   On: 11/27/2019 11:18   Result Date: 11/27/2019 CLINICAL DATA:  Left lower quadrant abdominal pain with change in bowel habits and weight loss. History of endometrial cancer and prior hysterectomy. EXAM: CT ABDOMEN AND PELVIS WITH CONTRAST TECHNIQUE: Multidetector CT imaging of the abdomen and pelvis was performed using the standard protocol following bolus administration of  intravenous contrast. CONTRAST:  174m OMNIPAQUE IOHEXOL 300 MG/ML  SOLN COMPARISON:  None. FINDINGS: Lower chest: Linear subsegmental atelectasis or scarring in the right middle lobe and in the lingula. Hepatobiliary: There are over 25 scattered enhancing masses in the liver. Index mass in segment 6 measures 8.8 by 7.5 cm on image 26/3. Index mass in segment 2 measures 6.0 by 4.1 cm. The masses are characteristic of metastatic lesions. Gallbladder unremarkable. No biliary dilatation. No appreciable thrombus or tumor thrombus  in the portal vein or hepatic veins. Pancreas: There is a mass in the pancreatic tail with following characteristics: Size: 8.8 by 5.7 by 5.1 cm (images 75/7 and 25/3). Location: Pancreatic tail Characterization: Solid Enhancement: Hypoenhancing Other Characteristics: No calcifications or adipose tissue Local extent of mass: Possible local extension along the lesser sac, abutting the margin of the stomach. Suspected direct invasion of the left adrenal gland. Vascular Involvement: Splenic vein occlusion. The mass abuts the upper half of the left renal vein. Splenic artery poorly seen in the vicinity of the mass. The mass abuts the left side of the common hepatic artery on images 68 through 70 of series 6. No definite involvement of the superior mesenteric artery. Variant hepatic artery anatomy: Conventional Bile Duct Involvement: Absent Variant biliary anatomy: Not appreciated Adjacent Nodes: Present, including a left periaortic node just below the left renal vein crossing which measures 1.2 cm in short axis on image 65/6. Omental/Peritoneal Disease: Absent Distant Metastases: Liver Spleen: Unremarkable splenic parenchyma. As noted above, the splenic artery is somewhat indistinct in the vicinity of the mass, and the splenic vein is occluded with collateral splenic venous drainage tracking adjacent to the greater curvature of the stomach. Adrenals/Urinary Tract: Suspected invasion of the left  adrenal gland by the pancreatic tail mass. Otherwise unremarkable. Stomach/Bowel: The pancreatic tail mass abuts the margin of the stomach without an obvious intact fat plane, for example on image 61/6. Although invasion of the gastric wall is not certain, it cannot be totally excluded. Vascular/Lymphatic: Vascular involvement of the pancreatic tail mass as noted above. Left periaortic lymph node below the renal vein is likely malignant as noted above. Reproductive: Prior hysterectomy and bilateral salpingo-oophorectomy. Other: No supplemental non-categorized findings. Musculoskeletal: Sclerosis along both sacroiliac joints compatible with sacroiliitis. Mild levoconvex lumbar scoliosis. Grade 1 degenerative anterolisthesis at L4-5 with associated disc bulge and degenerative facet arthropathy. There is also degenerative facet arthropathy at L3-4 and L5-S1. Lower thoracic spondylosis noted with multilevel spurring. IMPRESSION: 1. Hypoenhancing 8.8 cm in long axis pancreatic tail mass highly suspicious for pancreatic adenocarcinoma, with over 25 scattered enhancing liver lesions compatible metastatic disease to the liver. There is occlusion of the splenic vein with collateralization, poor definition of the splenic artery, and the mass is likely invading the left adrenal gland as well as abutting portions of the stomach and left renal vein. Likely malignant mildly enlarged lymph node below the left renal vein. 2. Chronic appearing bilateral sacroiliitis. 3. Lumbar scoliosis with spondylosis and degenerative disc disease. Radiology assistant personnel have been notified to put me in telephone contact with the referring physician or the referring physician's clinical representative in order to discuss these findings. Once this communication is established I will issue an addendum to this report for documentation purposes. Electronically Signed: By: Van Clines M.D. On: 11/27/2019 08:31    ASSESSMENT & PLAN: 64  yo female with   1. Pancreatic mass with liver lesions -We reviewed her medical record in detail with the patient. She presented with progressive LUQ pain, anorexia, and constipation. CT AP showed large mass in the pancreas tail and numerous liver lesions, overall highly suspicious for primary pancreas cancer with liver metastasis. We reviewed images with her today. -we reviewed the aggressive nature of metastatic pancreas cancer, including adenocarcinoma vs neuroendocrine tumor. Her clinical presentation is more consistent with adenocarcinoma.  -We are referring her for CT chest and US liver biopsy for complete staging and diagnosis. Will request FO testing on liver biopsy if positive for  cancer  -she will return for f/u in 2 weeks after work up is complete to review pathology, diagnosis, staging, and finalize treatment plan. Patient agrees. She was encouraged to bring her daughter.   2. Abdominal pain, n/v, constipation, anorexia and weight loss, secondary to #1 -She presented with LUQ pain in 05/2019 that gradually worsened and radiates to back, likely related to the pancreatic tail mass -More recently developed RUQ pain, likely related to multiple liver lesions, consistent with metastasis  -She tried NSAIDS which are not helpful, also makes her itch so she takes it with benadryl. - I reviewed potential side effects of tramadol, she is willing to try. She will take 1 tab q8h PRN -Due to constipation, secondary to #1, I recommend ODT zofran PRN. Hopefully this will help her eat more to gain weight back. I referred her to dietician today -For constipation, I recommend to start stool softener and laxative BID to maintain BM q1-2 days. She does not drink much, not sure if she would get full benefit of miralax. She will try dulcolax.  -I encouraged her to eat and drink more  3. Stage I FIGO grade 1 endometrioid carcinoma -presented with abdominal pain and PMB in 06/2019. It does not appear she had  imaging during work up -s/p total hyst with BSO and SLNB on 07/29/19 per Dr. Denman George -Path revealed endometrioid adenocarcinoma, FIGO grade 1, spanning 5.8 cm. Tumor invades less than one half of the myometrium. 0/3 SLNs with metastatic carcinoma, pT1aN0 -adjuvant therapy was not recommended -f/u with Dr. Denman George, last seen in 08/2019   4. Genetics -Due to her personal history of endometrial and (likely) pancreatic cancer, she qualifies for genetic testing to r/o pathogenic mutation such as BRCA which may have predisposed her to these cancers.  -she has 4 children and many grandchildren. She is interested and I referred her today -if BRCA mutation positive, and primary pancreatic adenocarcinoma is confirmed, she would be a candidate for PARP inhibitor in the future   5. Financial  -She does not work, takes care of her elderly mother who has dementia. She is uninsured. She uses her mother's SS for food and bills. She currently lives in her mother's home -she has a daughter who lives nearby and helps out. The patient, her mother, and daughter are currently looking for a larger home to live in together.  -She can drive herself if needed -I referred her to SW for financial resources.  -Once her diagnosis and treatment plan are finalized, will apply for drug assistance  -she has a coupon for co-pay assistance at her pharmacy, meds are free   6. Goals of care -awaiting definitive diagnosis. We reviewed the aggressive nature of metastatic pancreatic adenocarcinoma in general.  -She is agreeable to work up, but not sure if she will take chemotherapy. She seems interested if it will help her pain -she was encouraged to bring her daughter to next f/u -full code   PLAN: -CT-chest and liver biopsy for diagnosis  -f/u in 2 weeks to review path, diagnosis, staging, treatment plan -encouraged to bring family to next visit -referral to dietician, genetics, SW -Rx: tramadol, zofran   Orders Placed This  Encounter  Procedures  . CT Chest Wo Contrast    Standing Status:   Future    Standing Expiration Date:   11/30/2020    Order Specific Question:   ** REASON FOR EXAM (FREE TEXT)    Answer:   likely primary pancreas cancer, r/o metastasis  Order Specific Question:   Preferred imaging location?    Answer:   [facility]    Order Specific Question:   Radiology Contrast Protocol - do NOT remove file path    Answer:   [email or web address]  . US BIOPSY (LIVER)    Please obtain enough tissue for foundation one    Standing Status:   Future    Standing Expiration Date:   01/28/2021    Order Specific Question:   Lab orders requested (DO NOT place separate lab orders, these will be automatically ordered during procedure specimen collection):    Answer:   Surgical Pathology    Order Specific Question:   Reason for Exam (SYMPTOM  OR DIAGNOSIS REQUIRED)    Answer:   tissue diagnosis, likely metastatic pancreas cancer    Order Specific Question:   Preferred location?    Answer:   [facility]  . CBC with Differential ([place] Only)    Standing Status:   Standing    Number of Occurrences:   50    Standing Expiration Date:   11/30/2020  . CMP ([place] only)    Standing Status:   Standing    Number of Occurrences:   50    Standing Expiration Date:   11/30/2020  . CA 19.9    Standing Status:   Standing    Number of Occurrences:   50    Standing Expiration Date:   11/30/2020  . Ambulatory referral to Nutrition and Diabetic E    Referral Priority:   Routine    Referral Type:   Consultation    Referral Reason:   Specialty Services Required    Number of Visits Requested:   1  . Ambulatory referral to Genetics    Referral Priority:   Routine    Referral Type:   Consultation    Referral Reason:   Specialty Services Required    Number of Visits Requested:   1  . Ambulatory referral to Social Work    Referral Priority:   Urgent    Referral Type:    Consultation    Referral Reason:   Specialty Services Required    Number of Visits Requested:   1    All questions were answered. The patient knows to call the clinic with any problems, questions or concerns. I spent 45 minutes counseling the patient face to face. The total time spent in the appointment was 60 minutes and more than 50% was on counseling and review of test results.     Alla Feeling, NP 12/02/2019   Addendum  I have seen the patient, examined her. I agree with the assessment and and plan and have edited the notes.   Ms Crespo is a very nice 64 yo lady with past medical history of stage I endometrial cancer, state post surgery in September 2020, presents with worsening LUQ abdominal pain and weight loss.  I personally reviewed her CT images with patient in detail, this is highly suspicious for metastatic pancreatic cancer to liver.  I recommend ultrasound-guided liver biopsy for definitive tissue diagnosis.  Will obtain CT chest to complete staging.  We discussed the natural history of metastatic pancreatic malignancies including adenocarcinoma and neuroendocrine tumor, and treatment options.  She is interested in work-up, not sure if she wants to take chemo if she has metastatic pancreatic adenocarcinoma which indicating very poor prognosis and short life expectancy.  I encouraged her to bring her daughter with her on next visit  after biopsy.  She agrees.   Will refer her to genetics.   Truitt Merle  12/01/2019

## 2019-12-01 NOTE — Telephone Encounter (Signed)
Patient verified DOB Patient is aware of lesions being noted on her pancreas and liver and has appointment scheduled for today at 1:45. Patient will also follow up with gastro.

## 2019-12-02 ENCOUNTER — Encounter (HOSPITAL_COMMUNITY): Payer: Self-pay | Admitting: Radiology

## 2019-12-02 ENCOUNTER — Telehealth: Payer: Self-pay | Admitting: Nurse Practitioner

## 2019-12-02 ENCOUNTER — Encounter: Payer: Self-pay | Admitting: General Practice

## 2019-12-02 ENCOUNTER — Encounter: Payer: Self-pay | Admitting: Nurse Practitioner

## 2019-12-02 NOTE — Progress Notes (Signed)
Hannah Decker Female, 64 y.o., 1956-10-13 MRN:  [id number] Phone:  [phone] Hannah Decker) PCP:  Gildardo Pounds, NP Primary Cvg:  None Next Appt With Radiology ([facility] 2) 12/09/2019 at 2:00 PM  RE: US Liver Biopsy Received: Yesterday Message Contents  Markus Daft, MD  Arlyn Leak for US guided liver lesion biopsy.   [place]       Previous Messages   ----- Message -----  From: Garth Bigness D  Sent: 12/01/2019  4:58 PM EST  To: Ir Procedure Requests  Subject: FW: US Liver Biopsy                 ----- Message -----  From: Garth Bigness D  Sent: 12/01/2019  4:18 PM EST  To: [facility] Procedure Requests  Subject: US Liver Biopsy                  Procedure: US Liver Biopsy   Reason: Mass of pancreas, tissue diagnosis, likely metastatic pancreas cancer   History: CT in computer   Provider: Alla Feeling   Provider Contact:  [phone]

## 2019-12-02 NOTE — Telephone Encounter (Signed)
Scheduled appt per 1/18 sch message - pt aware of appt date and time   

## 2019-12-02 NOTE — Progress Notes (Signed)
[place] Initial Psychosocial Assessment Clinical Social Work  Clinical Social Work contacted by phone to assess psychosocial, emotional, mental health, and spiritual needs of the patient.   Barriers to care/review of distress screen:  - Transportation:  Do you anticipate any problems getting to appointments?  Do you have someone who can help run errands for you if you need it?  Son drives her to appointments. If transportation becomes an issue, she can be assisted w gas cards from [employer] or [employer] as needed.   - Help at home:  What is your living situation (alone, family, other)?  If you are physically unable to care for yourself, who would you call on to help you?  Is moving in w daughter so daughter can help w care of elderly grandmother with dementia.  Grandmother needs help w light housekeeping as well as some ADLs (bathing, toileting, dressing, uses walker).  Advised that patient needs to contact mother's PCP [facility]) for help with that need.   - Support system:  What does your support system look like?  Who would you call on if you needed some kind of practical help?  What if you needed someone to talk to for emotional support?  Children are supportive - daughter willing to help as needed.  - Finances:  Are you concerned about finances.  Considering returning to work?  If not, applying for disability?  Does not work, gets support from family. Worked for many years in physically demanding job, no longer able to work. Wants to apply for SS disability - has biopsy next week. Will refer to [facility] but will need biopsy/staging results in the chart. Gets Food Stamps, no food insecurity at this time.    What is your understanding of where you are with your cancer? Its cause?  Your treatment plan and what happens next?  New diagnosis - understands cancer may be in her liver and pancreas.  In midst of initial process of scans, biopsy scheduled for next  week.  CSW Summary:  Patient and family psychosocial functioning including strengths, limitations, and coping skills:  New diagnosis of metastatic cancer.  Unsure of exact nature of cancer and its spread.  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  Plans to apply for Medicaid at [place] - encouraged to work through resources available to her at her PCP office.  Identifications of barriers to care:  No insurance, no income.  Primary caregiver for elderly mother.   Availability of community resources:  [facility] as PCP.   Clinical Social Worker follow up needed: Yes.    Will schedule to meet w patient at her next visit at [facility] - also scheduled phone check in for next week.  Message sent to RN CM at [facility].  Referral will be made to [facility].  MEssage sent to [place] at [employer] . Other resources will be pending diagnosis and needs.  Edwyna Shell, LCSW Clinical Social Worker Phone:  [phone] Cell:  [phone]

## 2019-12-03 ENCOUNTER — Encounter: Payer: Self-pay | Admitting: General Practice

## 2019-12-03 ENCOUNTER — Telehealth: Payer: Self-pay | Admitting: Nurse Practitioner

## 2019-12-03 NOTE — Progress Notes (Signed)
[place] CSW Progress Notes  Referral submitted to [facility] for help w filing for disability if appropriate, pending results from biopsy and CT scan.  Edwyna Shell, LCSW Clinical Social Worker Phone:  [phone]

## 2019-12-03 NOTE — Telephone Encounter (Signed)
Scheduled appt per 1/19 sch message - unable to reach pt and unable to leave message. Mailed reminder letter with appt date and time

## 2019-12-04 ENCOUNTER — Telehealth: Payer: Self-pay

## 2019-12-04 ENCOUNTER — Other Ambulatory Visit: Payer: Self-pay | Admitting: Hematology

## 2019-12-04 MED ORDER — HYDROCODONE-ACETAMINOPHEN 5-325 MG PO TABS: 1.0000 | ORAL_TABLET | Freq: Four times a day (QID) | ORAL | 0 refills | Status: AC | PRN

## 2019-12-04 MED ORDER — HYDROCODONE-ACETAMINOPHEN 5-325 MG PO TABS: 1.0000 | ORAL_TABLET | Freq: Four times a day (QID) | ORAL | 0 refills | Status: DC | PRN

## 2019-12-04 NOTE — Telephone Encounter (Signed)
Done.   Fortunato Nordin MD  

## 2019-12-04 NOTE — Telephone Encounter (Signed)
Patient calls stating she is having increased pain since being seen by Cira Rue NP on 12/01/2019, she is having regular bowel movements, taking the Tramadol that was prescribed 50 mg one every 8 hours but states it is not helping.  Explained I would discuss with Dr. Burr Medico and call her back with her recommendations.  She verbalized an understanding.

## 2019-12-04 NOTE — Telephone Encounter (Signed)
I will call in Hydrocodone for her, let her watch her BM, and take laxities as needed. Thanks    Truitt Merle MD

## 2019-12-04 NOTE — Telephone Encounter (Signed)
Received call from [facility] that they do not fill scripts for narcotics.  Requested Dr. Burr Medico resend Hydrocodone to [facility] on file.

## 2019-12-08 ENCOUNTER — Other Ambulatory Visit: Payer: Self-pay | Admitting: Genetic Counselor

## 2019-12-08 DIAGNOSIS — C541 Malignant neoplasm of endometrium: Secondary | ICD-10-CM

## 2019-12-09 ENCOUNTER — Inpatient Hospital Stay: Payer: Self-pay

## 2019-12-09 ENCOUNTER — Ambulatory Visit (HOSPITAL_COMMUNITY)
Admission: RE | Admit: 2019-12-09 | Discharge: 2019-12-09 | Disposition: A | Discharge status: Home / Self Care | Payer: Self-pay | Source: Ambulatory Visit | Attending: Nurse Practitioner | Admitting: Nurse Practitioner

## 2019-12-09 ENCOUNTER — Encounter (HOSPITAL_COMMUNITY): Payer: Self-pay

## 2019-12-09 ENCOUNTER — Encounter: Payer: Self-pay | Admitting: General Practice

## 2019-12-09 ENCOUNTER — Inpatient Hospital Stay (HOSPITAL_BASED_OUTPATIENT_CLINIC_OR_DEPARTMENT_OTHER): Payer: Self-pay | Admitting: Genetic Counselor

## 2019-12-09 ENCOUNTER — Other Ambulatory Visit: Payer: Self-pay | Admitting: Radiology

## 2019-12-09 ENCOUNTER — Other Ambulatory Visit: Payer: Self-pay

## 2019-12-09 DIAGNOSIS — C541 Malignant neoplasm of endometrium: Secondary | ICD-10-CM

## 2019-12-09 DIAGNOSIS — Z809 Family history of malignant neoplasm, unspecified: Secondary | ICD-10-CM

## 2019-12-09 DIAGNOSIS — K8689 Other specified diseases of pancreas: Secondary | ICD-10-CM | POA: Insufficient documentation

## 2019-12-09 LAB — GENETIC SCREENING ORDER

## 2019-12-09 NOTE — Progress Notes (Signed)
Nutrition  Patient scheduled for nutrition appointment today at 2:30.  Arrived to appointment not feeling well, in pain.  Took pain medications and ate crackers.  RD offered to reschedule nutrition appointment and patient and daughter agreed.  Will plan on meeting on 2/3 for nutrition consult.  RD walked with patient and daughter to lab area this afternoon for lab draw.    Jance Siek B. Zenia Resides, [place], [place] Registered Dietitian [phone] (pager)

## 2019-12-09 NOTE — Progress Notes (Signed)
[place] Progress Notes  Patient signed authorization for [place] to assist w disability application - form provided to [facility] via secure email.  Edwyna Shell, LCSW Clinical Social Worker Phone:  [phone] Cell:  [phone]

## 2019-12-10 ENCOUNTER — Telehealth: Payer: Self-pay

## 2019-12-10 ENCOUNTER — Encounter: Payer: Self-pay | Admitting: Genetic Counselor

## 2019-12-10 ENCOUNTER — Ambulatory Visit (HOSPITAL_COMMUNITY): Admission: RE | Admit: 2019-12-10 | Payer: Self-pay | Source: Ambulatory Visit

## 2019-12-10 ENCOUNTER — Ambulatory Visit: Payer: Self-pay | Admitting: Physician Assistant

## 2019-12-10 ENCOUNTER — Ambulatory Visit (HOSPITAL_COMMUNITY): Payer: Self-pay

## 2019-12-10 DIAGNOSIS — K8689 Other specified diseases of pancreas: Secondary | ICD-10-CM | POA: Insufficient documentation

## 2019-12-10 DIAGNOSIS — Z809 Family history of malignant neoplasm, unspecified: Secondary | ICD-10-CM | POA: Insufficient documentation

## 2019-12-10 NOTE — Telephone Encounter (Signed)
Ms Grosshans called.  I let her know that her CT scan was negative.  She verbalized understanding.

## 2019-12-10 NOTE — Telephone Encounter (Signed)
I attempted to contact Ms Resnik this am.  I was unable to leave a vm.
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
## 2019-12-12 ENCOUNTER — Other Ambulatory Visit: Payer: Self-pay | Admitting: Hematology

## 2019-12-12 ENCOUNTER — Telehealth: Payer: Self-pay

## 2019-12-12 MED ORDER — OXYCODONE HCL 5 MG PO TABS: 5.0000 mg | ORAL_TABLET | Freq: Four times a day (QID) | ORAL | 0 refills | Status: DC | PRN

## 2019-12-12 NOTE — Telephone Encounter (Signed)
Hannah Decker left vm requesting refill for oxycodone.

## 2019-12-12 NOTE — Telephone Encounter (Signed)
She only has 2 pills left.  It sounds like she needs the oxycodone.

## 2019-12-12 NOTE — Progress Notes (Unsigned)
oxy

## 2019-12-12 NOTE — Telephone Encounter (Signed)
I gave her 30 tab hydrocordone on 1/21. Has she used all? Does she want refill of hydrocodone or she wants to change to oxycodone? Thanks   Truitt Merle MD

## 2019-12-12 NOTE — Telephone Encounter (Signed)
[facility], thanks  Truitt Merle MD

## 2019-12-14 ENCOUNTER — Other Ambulatory Visit: Payer: Self-pay | Admitting: Radiology

## 2019-12-16 ENCOUNTER — Other Ambulatory Visit: Payer: Self-pay

## 2019-12-16 ENCOUNTER — Ambulatory Visit (HOSPITAL_COMMUNITY)
Admission: RE | Admit: 2019-12-16 | Discharge: 2019-12-16 | Disposition: A | Discharge status: Home / Self Care | Payer: Self-pay | Source: Ambulatory Visit | Attending: Nurse Practitioner | Admitting: Nurse Practitioner

## 2019-12-16 DIAGNOSIS — Z79899 Other long term (current) drug therapy: Secondary | ICD-10-CM | POA: Insufficient documentation

## 2019-12-16 DIAGNOSIS — K869 Disease of pancreas, unspecified: Secondary | ICD-10-CM | POA: Insufficient documentation

## 2019-12-16 DIAGNOSIS — C7A1 Malignant poorly differentiated neuroendocrine tumors: Secondary | ICD-10-CM | POA: Insufficient documentation

## 2019-12-16 DIAGNOSIS — K8689 Other specified diseases of pancreas: Secondary | ICD-10-CM

## 2019-12-16 DIAGNOSIS — Z8249 Family history of ischemic heart disease and other diseases of the circulatory system: Secondary | ICD-10-CM | POA: Insufficient documentation

## 2019-12-16 DIAGNOSIS — Z8542 Personal history of malignant neoplasm of other parts of uterus: Secondary | ICD-10-CM | POA: Insufficient documentation

## 2019-12-16 DIAGNOSIS — I10 Essential (primary) hypertension: Secondary | ICD-10-CM | POA: Insufficient documentation

## 2019-12-16 LAB — COMPREHENSIVE METABOLIC PANEL
ALT: 71 U/L — ABNORMAL HIGH (ref 0–44)
AST: 92 U/L — ABNORMAL HIGH (ref 15–41)
Albumin: 2.8 g/dL — ABNORMAL LOW (ref 3.5–5.0)
Alkaline Phosphatase: 366 U/L — ABNORMAL HIGH (ref 38–126)
Anion gap: 12 (ref 5–15)
BUN: 9 mg/dL (ref 8–23)
CO2: 24 mmol/L (ref 22–32)
Calcium: 9.8 mg/dL (ref 8.9–10.3)
Chloride: 106 mmol/L (ref 98–111)
Creatinine, Ser: 1.08 mg/dL — ABNORMAL HIGH (ref 0.44–1.00)
GFR calc Af Amer: >60 mL/min (ref >60)
GFR calc non Af Amer: 54 mL/min — ABNORMAL LOW (ref >60)
Glucose, Bld: 107 mg/dL — ABNORMAL HIGH (ref 70–99)
Potassium: 4.2 mmol/L (ref 3.5–5.1)
Sodium: 142 mmol/L (ref 135–145)
Total Bilirubin: 0.9 mg/dL (ref 0.3–1.2)
Total Protein: 7.7 g/dL (ref 6.5–8.1)

## 2019-12-16 LAB — CBC
HCT: 36.7 % (ref 36.0–46.0)
Hemoglobin: 11.8 g/dL — ABNORMAL LOW (ref 12.0–15.0)
MCH: 25.4 pg — ABNORMAL LOW (ref 26.0–34.0)
MCHC: 32.2 g/dL (ref 30.0–36.0)
MCV: 78.9 fL — ABNORMAL LOW (ref 80.0–100.0)
Platelets: 487 10*3/uL — ABNORMAL HIGH (ref 150–400)
RBC: 4.65 MIL/uL (ref 3.87–5.11)
RDW: 16.2 % — ABNORMAL HIGH (ref 11.5–15.5)
WBC: 10.9 10*3/uL — ABNORMAL HIGH (ref 4.0–10.5)
nRBC: 0 % (ref 0.0–0.2)

## 2019-12-16 LAB — PROTIME-INR
INR: 1.1 (ref 0.8–1.2)
Prothrombin Time: 14.2 seconds (ref 11.4–15.2)

## 2019-12-16 MED ORDER — FENTANYL CITRATE (PF) 100 MCG/2ML IJ SOLN
INTRAMUSCULAR | Status: AC
  Filled 2019-12-16: qty 2

## 2019-12-16 MED ORDER — GELATIN ABSORBABLE 12-7 MM EX MISC
CUTANEOUS | Status: AC
  Filled 2019-12-16: qty 1

## 2019-12-16 MED ORDER — MIDAZOLAM HCL 2 MG/2ML IJ SOLN
INTRAMUSCULAR | Status: AC | PRN
  Administered 2019-12-16: 1 mg via INTRAVENOUS

## 2019-12-16 MED ORDER — SODIUM CHLORIDE 0.9 % IV SOLN
INTRAVENOUS | Status: AC | PRN
  Administered 2019-12-16: 10 mL/h via INTRAVENOUS

## 2019-12-16 MED ORDER — SODIUM CHLORIDE 0.9 % IV SOLN: INTRAVENOUS | Status: DC

## 2019-12-16 MED ORDER — MIDAZOLAM HCL 2 MG/2ML IJ SOLN
INTRAMUSCULAR | Status: AC
  Filled 2019-12-16: qty 2

## 2019-12-16 MED ORDER — FENTANYL CITRATE (PF) 100 MCG/2ML IJ SOLN
INTRAMUSCULAR | Status: AC | PRN
  Administered 2019-12-16 (×2): 50 ug via INTRAVENOUS

## 2019-12-16 MED ORDER — LIDOCAINE HCL (PF) 1 % IJ SOLN
INTRAMUSCULAR | Status: AC
  Filled 2019-12-16: qty 30

## 2019-12-16 NOTE — [place] Documentation (Signed)
02 d/c 

## 2019-12-16 NOTE — Discharge Instructions (Signed)

## 2019-12-16 NOTE — H&P (Signed)
Chief Complaint: Patient was seen in consultation today for liver biopsy at the request of Burton,Lacie K  Referring Physician(s): Burton,Lacie K  Supervising Physician: Daryll Brod  Patient Status: [facility] - Out-pt  History of Present Illness: Hannah Decker is a 64 y.o. female being worked up for pancreatic mass with liver lesions. She is referred for image guided biopsy of liver lesion. PMHx, meds, labs, imaging, allergies reviewed. Feels well, no recent fevers, chills, illness. Has been NPO today as directed.   Past Medical History:  Diagnosis Date  . Arthritis    both knees  . Bilirubinuria   . Endometrial cancer ([place]) dx'd 07/2019   surg only  . Family history of adverse reaction to anesthesia    hard to wake up  . Family history of cancer   . GERD (gastroesophageal reflux disease)   . Hypertension   . Mass of pancreas     Past Surgical History:  Procedure Laterality Date  . ABDOMINAL HYSTERECTOMY    . HERNIA REPAIR    . ROBOTIC ASSISTED TOTAL HYSTERECTOMY WITH BILATERAL SALPINGO OOPHERECTOMY Bilateral 07/29/2019   Procedure: ROBOTIC ASSISTED TOTAL HYSTERECTOMY WITH BILATERAL SALPINGO OOPHORECTOMY;  Surgeon: Everitt Amber, MD;  Location: [facility];  Service: Gynecology;  Laterality: Bilateral;  . SENTINEL NODE BIOPSY Bilateral 07/29/2019   Procedure: SENTINEL NODE BIOPSY;  Surgeon: Everitt Amber, MD;  Location: [facility];  Service: Gynecology;  Laterality: Bilateral;  . TUBAL LIGATION      Allergies: Penicillins, Ibuprofen, Other, Ace inhibitors, and Hydrochlorothiazide  Medications: Prior to Admission medications   Medication Sig Start Date End Date Taking? Authorizing Provider  HYDROcodone-acetaminophen (NORCO) 5-325 MG tablet Take 1 tablet by mouth every 6 (six) hours as needed for severe pain. 12/04/19  Yes Truitt Merle, MD  oxyCODONE (OXY IR/ROXICODONE) 5 MG immediate release tablet Take 1 tablet (5 mg total) by mouth every 6 (six) hours as needed for severe pain.  12/12/19  Yes Truitt Merle, MD  ondansetron (ZOFRAN-ODT) 4 MG disintegrating tablet Take 1 tablet (4 mg total) by mouth every 8 (eight) hours as needed for nausea or vomiting. 12/01/19   Alla Feeling, NP  ranitidine (ZANTAC) 300 MG tablet Take 1 tablet (300 mg total) by mouth at bedtime. Patient not taking: Reported on 12/01/2019 11/24/19   Argentina Donovan, PA-C  traMADol (ULTRAM) 50 MG tablet Take 1 tablet (50 mg total) by mouth every 8 (eight) hours as needed for severe pain. 12/01/19   Alla Feeling, NP     Family History  Problem Relation Age of Onset  . Hypertension Mother   . Diabetes Father   . Hypertension Father   . Lung cancer Cousin   . Other Brother 80       "spot on lung" - possible cancer  . Cancer Maternal Grandmother        unknown type, dx. >50  . Blindness Maternal Grandfather   . Diabetes Paternal Grandmother   . Heart Problems Paternal Grandfather   . Cancer Nephew 44       unknown type  . Cancer Cousin        unknown type, dx. early 37s (maternal cousin x2)    Social History   Socioeconomic History  . Marital status: Divorced    Spouse name: Not on file  . Number of children: Not on file  . Years of education: Not on file  . Highest education level: Not on file  Occupational History  . Not on file  Tobacco Use  . Smoking status: Never Smoker  . Smokeless tobacco: Never Used  Substance and Sexual Activity  . Alcohol use: No  . Drug use: No  . Sexual activity: Not Currently  Other Topics Concern  . Not on file  Social History Narrative  . Not on file   Social Determinants of Health   Financial Resource Strain:   . Difficulty of Paying Living Expenses: Not on file  Food Insecurity:   . Worried About Charity fundraiser in the Last Year: Not on file  . Ran Out of Food in the Last Year: Not on file  Transportation Needs:   . Lack of Transportation (Medical): Not on file  . Lack of Transportation (Non-Medical): Not on file  Physical Activity:     . Days of Exercise per Week: Not on file  . Minutes of Exercise per Session: Not on file  Stress:   . Feeling of Stress : Not on file  Social Connections:   . Frequency of Communication with Friends and Family: Not on file  . Frequency of Social Gatherings with Friends and Family: Not on file  . Attends Religious Services: Not on file  . Active Member of Clubs or Organizations: Not on file  . Attends Archivist Meetings: Not on file  . Marital Status: Not on file     Review of Systems: A 12 point ROS discussed and pertinent positives are indicated in the HPI above.  All other systems are negative.  Review of Systems  Vital Signs: BP (!) 155/87   Pulse 90   Temp 98.7 F (37.1 C)   Ht 5' 6.5" (1.689 m)   Wt 86.2 kg   SpO2 99%   BMI 30.21 kg/m   Physical Exam  Imaging: CT Chest Wo Contrast  Result Date: 12/09/2019 CLINICAL DATA:  Staging pancreatic cancer EXAM: CT CHEST WITHOUT CONTRAST TECHNIQUE: Multidetector CT imaging of the chest was performed following the standard protocol without IV contrast. COMPARISON:  CT abdomen 11/26/2019 FINDINGS: Cardiovascular: Atherosclerotic calcification of the aortic arch noted. Mediastinum/Nodes: Small hypodense thyroid nodules are present but do not warrant further workup based on size. No significant thoracic adenopathy observed. Lungs/Pleura: Linear scarring medially in the right middle lobe. Scarring and potentially superimposed atelectasis in the lingula. No findings of metastatic disease to the lungs. Upper Abdomen: Numerous hypodense masses in the liver compatible with known metastatic lesions. Large pancreatic tail mass as shown on recent dedicated CT abdomen. Musculoskeletal: Thoracic spondylosis and dextroconvex thoracic scoliosis with rotary component. IMPRESSION: 1. No findings of metastatic disease to the chest. 2. Large pancreatic tail mass as shown on recent dedicated CT abdomen. Numerous hypodense masses in the liver  compatible with known metastatic lesions. 3. Thoracic spondylosis and dextroconvex thoracic scoliosis with rotary component. 4.  Aortic Atherosclerosis (ICD10-I70.0). Electronically Signed   By: Van Clines M.D.   On: 12/09/2019 15:29   CT Abdomen Pelvis W Contrast  Addendum Date: 11/27/2019   ADDENDUM REPORT: 11/27/2019 11:18 ADDENDUM: The original report was by Dr. Van Clines. The following addendum is by Dr. Van Clines: These results were called by telephone on 11/27/2019 at 11:17 am to provider Dr. Freeman Caldron , who verbally acknowledged these results. Electronically Signed   By: Van Clines M.D.   On: 11/27/2019 11:18   Result Date: 11/27/2019 CLINICAL DATA:  Left lower quadrant abdominal pain with change in bowel habits and weight loss. History of endometrial cancer and prior hysterectomy. EXAM: CT ABDOMEN  AND PELVIS WITH CONTRAST TECHNIQUE: Multidetector CT imaging of the abdomen and pelvis was performed using the standard protocol following bolus administration of intravenous contrast. CONTRAST:  163mL OMNIPAQUE IOHEXOL 300 MG/ML  SOLN COMPARISON:  None. FINDINGS: Lower chest: Linear subsegmental atelectasis or scarring in the right middle lobe and in the lingula. Hepatobiliary: There are over 25 scattered enhancing masses in the liver. Index mass in segment 6 measures 8.8 by 7.5 cm on image 26/3. Index mass in segment 2 measures 6.0 by 4.1 cm. The masses are characteristic of metastatic lesions. Gallbladder unremarkable. No biliary dilatation. No appreciable thrombus or tumor thrombus in the portal vein or hepatic veins. Pancreas: There is a mass in the pancreatic tail with following characteristics: Size: 8.8 by 5.7 by 5.1 cm (images 75/7 and 25/3). Location: Pancreatic tail Characterization: Solid Enhancement: Hypoenhancing Other Characteristics: No calcifications or adipose tissue Local extent of mass: Possible local extension along the lesser sac, abutting the margin  of the stomach. Suspected direct invasion of the left adrenal gland. Vascular Involvement: Splenic vein occlusion. The mass abuts the upper half of the left renal vein. Splenic artery poorly seen in the vicinity of the mass. The mass abuts the left side of the common hepatic artery on images 68 through 70 of series 6. No definite involvement of the superior mesenteric artery. Variant hepatic artery anatomy: Conventional Bile Duct Involvement: Absent Variant biliary anatomy: Not appreciated Adjacent Nodes: Present, including a left periaortic node just below the left renal vein crossing which measures 1.2 cm in short axis on image 65/6. Omental/Peritoneal Disease: Absent Distant Metastases: Liver Spleen: Unremarkable splenic parenchyma. As noted above, the splenic artery is somewhat indistinct in the vicinity of the mass, and the splenic vein is occluded with collateral splenic venous drainage tracking adjacent to the greater curvature of the stomach. Adrenals/Urinary Tract: Suspected invasion of the left adrenal gland by the pancreatic tail mass. Otherwise unremarkable. Stomach/Bowel: The pancreatic tail mass abuts the margin of the stomach without an obvious intact fat plane, for example on image 61/6. Although invasion of the gastric wall is not certain, it cannot be totally excluded. Vascular/Lymphatic: Vascular involvement of the pancreatic tail mass as noted above. Left periaortic lymph node below the renal vein is likely malignant as noted above. Reproductive: Prior hysterectomy and bilateral salpingo-oophorectomy. Other: No supplemental non-categorized findings. Musculoskeletal: Sclerosis along both sacroiliac joints compatible with sacroiliitis. Mild levoconvex lumbar scoliosis. Grade 1 degenerative anterolisthesis at L4-5 with associated disc bulge and degenerative facet arthropathy. There is also degenerative facet arthropathy at L3-4 and L5-S1. Lower thoracic spondylosis noted with multilevel spurring.  IMPRESSION: 1. Hypoenhancing 8.8 cm in long axis pancreatic tail mass highly suspicious for pancreatic adenocarcinoma, with over 25 scattered enhancing liver lesions compatible metastatic disease to the liver. There is occlusion of the splenic vein with collateralization, poor definition of the splenic artery, and the mass is likely invading the left adrenal gland as well as abutting portions of the stomach and left renal vein. Likely malignant mildly enlarged lymph node below the left renal vein. 2. Chronic appearing bilateral sacroiliitis. 3. Lumbar scoliosis with spondylosis and degenerative disc disease. Radiology assistant personnel have been notified to put me in telephone contact with the referring physician or the referring physician's clinical representative in order to discuss these findings. Once this communication is established I will issue an addendum to this report for documentation purposes. Electronically Signed: By: Van Clines M.D. On: 11/27/2019 08:31    Labs:  CBC: Recent Labs  07/25/19 1501 11/19/19 1149 12/16/19 1151  WBC 6.5 7.2 10.9*  HGB 14.0 13.5 11.8*  HCT 44.8 41.1 36.7  PLT 276 272 487*    COAGS: Recent Labs    12/16/19 1151  INR 1.1    BMP: Recent Labs    07/25/19 1501 11/19/19 1149  NA 143 140  K 3.4* 4.0  CL 109 101  CO2 27 26  GLUCOSE 96 102*  BUN 19 10  CALCIUM 9.1 10.0  CREATININE 1.05* 1.12*  GFRNONAA 56* 52*  GFRAA >60 60    LIVER FUNCTION TESTS: Recent Labs    07/25/19 1501 11/19/19 1149  BILITOT 0.6 0.6  AST 16 53*  ALT 16 21  ALKPHOS 81 130*  PROT 8.2* 7.5  ALBUMIN 3.9 4.1    TUMOR MARKERS: No results for input(s): AFPTM, CEA, CA199, CHROMGRNA in the last 8760 hours.  Assessment and Plan: Pancreatic mass Liver lesions For US guided bx of liver lesion Labs ok Risks and benefits of liver bx was discussed with the patient and/or patient's family including, but not limited to bleeding, infection, damage to  adjacent structures or low yield requiring additional tests.  All of the questions were answered and there is agreement to proceed.  Consent signed and in chart.    Thank you for this interesting consult.  I greatly enjoyed meeting Hannah Decker and look forward to participating in their care.  A copy of this report was sent to the requesting provider on this date.  Electronically Signed: Ascencion Dike, PA-C 12/16/2019, 12:39 PM   I spent a total of 25 minutes in face to face in clinical consultation, greater than 50% of which was counseling/coordinating care for liver bx

## 2019-12-16 NOTE — Procedures (Signed)
Liver mets, imaging c/w panc primary  S/p [place] BX ANTERIOR LEFT LIVER MET  No comp Stable ebl min Full report in pacs Path pending

## 2019-12-16 NOTE — Progress Notes (Deleted)
[place]   [phone] [phone]   Clinic Follow up Note   Patient Care Team: Gildardo Pounds, NP as PCP - General (Nurse Practitioner) 12/16/2019  CHIEF COMPLAINT: F/u pancreas mass   SUMMARY OF ONCOLOGIC HISTORY: Oncology History  Endometrial ca [facility])  07/04/2019 Initial Diagnosis   Endometrial ca [facility])     CURRENT THERAPY:  PENDING final path   INTERVAL HISTORY: Ms. Blacknall returns for f/u as scheduled. She underwent further CT scan on 1/26 and liver biopsy on 12/16/19.    REVIEW OF SYSTEMS:   Constitutional: Denies fevers, chills or abnormal weight loss Eyes: Denies blurriness of vision Ears, nose, mouth, throat, and face: Denies mucositis or sore throat Respiratory: Denies cough, dyspnea or wheezes Cardiovascular: Denies palpitation, chest discomfort or lower extremity swelling Gastrointestinal:  Denies nausea, heartburn or change in bowel habits Skin: Denies abnormal skin rashes Lymphatics: Denies new lymphadenopathy or easy bruising Neurological:Denies numbness, tingling or new weaknesses Behavioral/Psych: Mood is stable, no new changes  All other systems were reviewed with the patient and are negative.  MEDICAL HISTORY:  Past Medical History:  Diagnosis Date  . Arthritis    both knees  . Bilirubinuria   . Endometrial cancer ([place]) dx'd 07/2019   surg only  . Family history of adverse reaction to anesthesia    hard to wake up  . Family history of cancer   . GERD (gastroesophageal reflux disease)   . Hypertension   . Mass of pancreas     SURGICAL HISTORY: Past Surgical History:  Procedure Laterality Date  . ABDOMINAL HYSTERECTOMY    . HERNIA REPAIR    . ROBOTIC ASSISTED TOTAL HYSTERECTOMY WITH BILATERAL SALPINGO OOPHERECTOMY Bilateral 07/29/2019   Procedure: ROBOTIC ASSISTED TOTAL HYSTERECTOMY WITH BILATERAL SALPINGO OOPHORECTOMY;  Surgeon: Everitt Amber, MD;  Location: [facility];  Service: Gynecology;  Laterality: Bilateral;    . SENTINEL NODE BIOPSY Bilateral 07/29/2019   Procedure: SENTINEL NODE BIOPSY;  Surgeon: Everitt Amber, MD;  Location: [facility];  Service: Gynecology;  Laterality: Bilateral;  . TUBAL LIGATION      I have reviewed the social history and family history with the patient and they are unchanged from previous note.  ALLERGIES:  is allergic to penicillins; ibuprofen; other; ace inhibitors; and hydrochlorothiazide.  MEDICATIONS:  Current Outpatient Medications  Medication Sig Dispense Refill  . HYDROcodone-acetaminophen (NORCO) 5-325 MG tablet Take 1 tablet by mouth every 6 (six) hours as needed for severe pain. 30 tablet 0  . ondansetron (ZOFRAN-ODT) 4 MG disintegrating tablet Take 1 tablet (4 mg total) by mouth every 8 (eight) hours as needed for nausea or vomiting. 30 tablet 0  . oxyCODONE (OXY IR/ROXICODONE) 5 MG immediate release tablet Take 1 tablet (5 mg total) by mouth every 6 (six) hours as needed for severe pain. 60 tablet 0  . ranitidine (ZANTAC) 300 MG tablet Take 1 tablet (300 mg total) by mouth at bedtime. (Patient not taking: Reported on 12/01/2019) 30 tablet 2  . traMADol (ULTRAM) 50 MG tablet Take 1 tablet (50 mg total) by mouth every 8 (eight) hours as needed for severe pain. 30 tablet 0   No current facility-administered medications for this visit.   Facility-Administered Medications Ordered in Other Visits  Medication Dose Route Frequency Provider Last Rate Last Admin  . 0.9 %  sodium chloride infusion   Intravenous Continuous Omohundro, Jennifer C, NP      . fentaNYL (SUBLIMAZE) 100 MCG/2ML injection           .  gelatin adsorbable (GELFOAM/SURGIFOAM) 12-7 MM sponge 12-7 mm           . lidocaine (PF) (XYLOCAINE) 1 % injection           . midazolam (VERSED) 2 MG/2ML injection             PHYSICAL EXAMINATION: ECOG PERFORMANCE STATUS: {CHL ONC ECOG PS:[id number]}  There were no vitals filed for this visit. There were no vitals filed for this visit.  GENERAL:alert, no  distress and comfortable SKIN: skin color, texture, turgor are normal, no rashes or significant lesions EYES: normal, Conjunctiva are pink and non-injected, sclera clear OROPHARYNX:no exudate, no erythema and lips, buccal mucosa, and tongue normal  NECK: supple, thyroid normal size, non-tender, without nodularity LYMPH:  no palpable lymphadenopathy in the cervical, axillary or inguinal LUNGS: clear to auscultation and percussion with normal breathing effort HEART: regular rate & rhythm and no murmurs and no lower extremity edema ABDOMEN:abdomen soft, non-tender and normal bowel sounds Musculoskeletal:no cyanosis of digits and no clubbing  NEURO: alert & oriented x 3 with fluent speech, no focal motor/sensory deficits  LABORATORY DATA:  I have reviewed the data as listed CBC Latest Ref Rng & Units 12/16/2019 11/19/2019 07/25/2019  WBC 4.0 - 10.5 K/uL 10.9(H) 7.2 6.5  Hemoglobin 12.0 - 15.0 g/dL 11.8(L) 13.5 14.0  Hematocrit 36.0 - 46.0 % 36.7 41.1 44.8  Platelets 150 - 400 K/uL 487(H) 272 276     CMP Latest Ref Rng & Units 12/16/2019 11/19/2019 07/25/2019  Glucose 70 - 99 mg/dL 107(H) 102(H) 96  BUN 8 - 23 mg/dL 9 10 19   Creatinine 0.44 - 1.00 mg/dL 1.08(H) 1.12(H) 1.05(H)  Sodium 135 - 145 mmol/L 142 140 143  Potassium 3.5 - 5.1 mmol/L 4.2 4.0 3.4(L)  Chloride 98 - 111 mmol/L 106 101 109  CO2 22 - 32 mmol/L 24 26 27   Calcium 8.9 - 10.3 mg/dL 9.8 10.0 9.1  Total Protein 6.5 - 8.1 g/dL 7.7 7.5 8.2(H)  Total Bilirubin 0.3 - 1.2 mg/dL 0.9 0.6 0.6  Alkaline Phos 38 - 126 U/L 366(H) 130(H) 81  AST 15 - 41 U/L 92(H) 53(H) 16  ALT 0 - 44 U/L 71(H) 21 16      RADIOGRAPHIC STUDIES: I have personally reviewed the radiological images as listed and agreed with the findings in the report. US BIOPSY (LIVER)  Result Date: 12/16/2019 INDICATION: Liver metastases, suspect pancreas primary EXAM: ULTRASOUND ANTERIOR LIVER METASTASIS 18 GAUGE CORE BIOPSY MEDICATIONS: 1% lidocaine local  ANESTHESIA/SEDATION: Moderate (conscious) sedation was employed during this procedure. A total of Versed 1.0 mg and Fentanyl 50 mcg was administered intravenously. Moderate Sedation Time: 10 minutes. The patient's level of consciousness and vital signs were monitored continuously by radiology nursing throughout the procedure under my direct supervision. FLUOROSCOPY TIME:  Fluoroscopy Time: None. COMPLICATIONS: None immediate. PROCEDURE: Informed written consent was obtained from the patient after a thorough discussion of the procedural risks, benefits and alternatives. All questions were addressed. Maximal Sterile Barrier Technique was utilized including caps, mask, sterile gowns, sterile gloves, sterile drape, hand hygiene and skin antiseptic. A timeout was performed prior to the initiation of the procedure. Previous imaging reviewed. Preliminary ultrasound performed. An anterior liver metastasis was localized in the right subcostal area. Overlying skin marked. Under sterile conditions and local anesthesia, a 17 gauge coaxial guide needle was advanced into the lesion. Needle position confirmed with ultrasound. 18 gauge core biopsies obtained. Samples placed in formalin. Samples were intact and non fragmented. Needle tract  occluded with Gel-Foam. Postprocedure imaging demonstrates no hemorrhage or hematoma. Patient tolerated the biopsy well. IMPRESSION: Successful ultrasound anterior liver metastasis 18 gauge core biopsy Electronically Signed   By: Jerilynn Mages.  Shick M.D.   On: 12/16/2019 14:16     ASSESSMENT & PLAN:  No problem-specific Assessment & Plan notes found for this encounter.   No orders of the defined types were placed in this encounter.  All questions were answered. The patient knows to call the clinic with any problems, questions or concerns. No barriers to learning was detected. I spent {CHL ONC TIME VISIT - [id number] counseling the patient face to face. The total time spent in the  appointment was {CHL ONC TIME VISIT - [id number] and more than 50% was on counseling and review of test results     Alla Feeling, NP 12/16/19

## 2019-12-16 NOTE — Sedation Documentation (Signed)
Having 8-9/10 aching pain at biopsy site area. Dr Annamaria Boots notified. To give Fentanyl 2mcg IV and have [place] tech check the site.

## 2019-12-16 NOTE — Sedation Documentation (Signed)
Ritu Saigal came in and did an ultrasound of the biopsy site and everything looks good. She spoke to Dr Annamaria Boots and let him know what she saw on ultrasound and I spoke to him to let him know her pain was now gone post IV Fentanyl.

## 2019-12-17 ENCOUNTER — Inpatient Hospital Stay: Payer: Self-pay

## 2019-12-17 ENCOUNTER — Inpatient Hospital Stay: Payer: Self-pay | Admitting: Nurse Practitioner

## 2019-12-17 ENCOUNTER — Telehealth: Payer: Self-pay | Admitting: Nurse Practitioner

## 2019-12-17 ENCOUNTER — Telehealth: Payer: Self-pay | Admitting: General Practice

## 2019-12-17 ENCOUNTER — Inpatient Hospital Stay: Payer: Self-pay | Attending: Nurse Practitioner | Admitting: General Practice

## 2019-12-17 DIAGNOSIS — Z79899 Other long term (current) drug therapy: Secondary | ICD-10-CM | POA: Insufficient documentation

## 2019-12-17 DIAGNOSIS — R1012 Left upper quadrant pain: Secondary | ICD-10-CM | POA: Insufficient documentation

## 2019-12-17 DIAGNOSIS — D378 Neoplasm of uncertain behavior of other specified digestive organs: Secondary | ICD-10-CM | POA: Insufficient documentation

## 2019-12-17 DIAGNOSIS — K5900 Constipation, unspecified: Secondary | ICD-10-CM | POA: Insufficient documentation

## 2019-12-17 DIAGNOSIS — K219 Gastro-esophageal reflux disease without esophagitis: Secondary | ICD-10-CM | POA: Insufficient documentation

## 2019-12-17 DIAGNOSIS — R630 Anorexia: Secondary | ICD-10-CM | POA: Insufficient documentation

## 2019-12-17 DIAGNOSIS — R112 Nausea with vomiting, unspecified: Secondary | ICD-10-CM | POA: Insufficient documentation

## 2019-12-17 DIAGNOSIS — K769 Liver disease, unspecified: Secondary | ICD-10-CM | POA: Insufficient documentation

## 2019-12-17 DIAGNOSIS — I10 Essential (primary) hypertension: Secondary | ICD-10-CM | POA: Insufficient documentation

## 2019-12-17 DIAGNOSIS — C541 Malignant neoplasm of endometrium: Secondary | ICD-10-CM | POA: Insufficient documentation

## 2019-12-17 DIAGNOSIS — M1990 Unspecified osteoarthritis, unspecified site: Secondary | ICD-10-CM | POA: Insufficient documentation

## 2019-12-17 DIAGNOSIS — R822 Biliuria: Secondary | ICD-10-CM | POA: Insufficient documentation

## 2019-12-17 DIAGNOSIS — Z90722 Acquired absence of ovaries, bilateral: Secondary | ICD-10-CM | POA: Insufficient documentation

## 2019-12-17 DIAGNOSIS — R634 Abnormal weight loss: Secondary | ICD-10-CM | POA: Insufficient documentation

## 2019-12-17 DIAGNOSIS — C7A1 Malignant poorly differentiated neuroendocrine tumors: Secondary | ICD-10-CM | POA: Insufficient documentation

## 2019-12-17 DIAGNOSIS — Z90710 Acquired absence of both cervix and uterus: Secondary | ICD-10-CM | POA: Insufficient documentation

## 2019-12-17 DIAGNOSIS — R5383 Other fatigue: Secondary | ICD-10-CM | POA: Insufficient documentation

## 2019-12-17 DIAGNOSIS — C7B8 Other secondary neuroendocrine tumors: Secondary | ICD-10-CM | POA: Insufficient documentation

## 2019-12-17 NOTE — Telephone Encounter (Signed)
I called pathology this morning, her case has been received but not reviewed yet so there is no final path report. I called Hannah Decker to let her know and gave her the opportunity to be seen anyway vs reschedule. She prefers to reschedule and I have sent a message. She is tired but otherwise doing OK, denies pain, able to eat/drink and be up and about at home. She has no issues or questions at this time and appreciates the call.  Hannah Rue, NP  12/17/2019

## 2019-12-17 NOTE — Telephone Encounter (Signed)
[place] CSW Progress Notes  Call to patient to check on progress [facility] has begun process of assisting her with disability application.  No other concerns at this time.  Advised her to keep in touch w [facility] throughout this process to ensure all goes smoothly. She has their contact information.  Edwyna Shell, LCSW Clinical Social Worker Phone:  [phone]

## 2019-12-17 NOTE — Progress Notes (Signed)
Nutrition  Called patient several times today.  Patient did not show up for nutrition appointment at 12:45 but noted MD appointment cancelled per to RD visit.  RD was able to speak with patient this afternoon (3:30pm) and asked patient if we could talk via phone.  Patient reports that she is not feeling well today.   RD will try and reach patient next Wednesday, Feb 10th for phone visit.  Patient agreeable.  Ceil Roderick B. Zenia Resides, [place], [place] Registered Dietitian [phone] (pager)

## 2019-12-18 ENCOUNTER — Telehealth: Payer: Self-pay

## 2019-12-18 ENCOUNTER — Inpatient Hospital Stay (HOSPITAL_BASED_OUTPATIENT_CLINIC_OR_DEPARTMENT_OTHER): Payer: Self-pay | Admitting: Nurse Practitioner

## 2019-12-18 ENCOUNTER — Other Ambulatory Visit: Payer: Self-pay

## 2019-12-18 ENCOUNTER — Inpatient Hospital Stay: Payer: Self-pay

## 2019-12-18 ENCOUNTER — Encounter: Payer: Self-pay | Admitting: Nurse Practitioner

## 2019-12-18 VITALS — BP 144/85 | HR 97 | Temp 97.8°F | Resp 18 | Ht 66.5 in | Wt 188.3 lb

## 2019-12-18 DIAGNOSIS — K8689 Other specified diseases of pancreas: Secondary | ICD-10-CM

## 2019-12-18 LAB — CBC WITH DIFFERENTIAL (CANCER CENTER ONLY)
Abs Immature Granulocytes: 0.06 10*3/uL (ref 0.00–0.07)
Basophils Absolute: 0 10*3/uL (ref 0.0–0.1)
Basophils Relative: 0 %
Eosinophils Absolute: 0 10*3/uL (ref 0.0–0.5)
Eosinophils Relative: 0 %
HCT: 37.5 % (ref 36.0–46.0)
Hemoglobin: 12.2 g/dL (ref 12.0–15.0)
Immature Granulocytes: 1 %
Lymphocytes Relative: 8 %
Lymphs Abs: 1 10*3/uL (ref 0.7–4.0)
MCH: 25.5 pg — ABNORMAL LOW (ref 26.0–34.0)
MCHC: 32.5 g/dL (ref 30.0–36.0)
MCV: 78.3 fL — ABNORMAL LOW (ref 80.0–100.0)
Monocytes Absolute: 1.4 10*3/uL — ABNORMAL HIGH (ref 0.1–1.0)
Monocytes Relative: 11 %
Neutro Abs: 10.3 10*3/uL — ABNORMAL HIGH (ref 1.7–7.7)
Neutrophils Relative %: 80 %
Platelet Count: 535 10*3/uL — ABNORMAL HIGH (ref 150–400)
RBC: 4.79 MIL/uL (ref 3.87–5.11)
RDW: 15.8 % — ABNORMAL HIGH (ref 11.5–15.5)
WBC Count: 12.8 10*3/uL — ABNORMAL HIGH (ref 4.0–10.5)
nRBC: 0 % (ref 0.0–0.2)

## 2019-12-18 LAB — CMP (CANCER CENTER ONLY)
ALT: 119 U/L — ABNORMAL HIGH (ref 0–44)
AST: 249 U/L (ref 15–41)
Albumin: 3.1 g/dL — ABNORMAL LOW (ref 3.5–5.0)
Alkaline Phosphatase: 526 U/L — ABNORMAL HIGH (ref 38–126)
Anion gap: 11 (ref 5–15)
BUN: 10 mg/dL (ref 8–23)
CO2: 27 mmol/L (ref 22–32)
Calcium: 10.2 mg/dL (ref 8.9–10.3)
Chloride: 102 mmol/L (ref 98–111)
Creatinine: 1.03 mg/dL — ABNORMAL HIGH (ref 0.44–1.00)
GFR, Est AFR Am: >60 mL/min (ref >60)
GFR, Estimated: 57 mL/min — ABNORMAL LOW (ref >60)
Glucose, Bld: 117 mg/dL — ABNORMAL HIGH (ref 70–99)
Potassium: 4.4 mmol/L (ref 3.5–5.1)
Sodium: 140 mmol/L (ref 135–145)
Total Bilirubin: 0.9 mg/dL (ref 0.3–1.2)
Total Protein: 8.4 g/dL — ABNORMAL HIGH (ref 6.5–8.1)

## 2019-12-18 NOTE — Progress Notes (Signed)
[place]   [phone] Fax:(336) [phone]   Clinic Follow up Note   Patient Care Team: Gildardo Pounds, NP as PCP - General (Nurse Practitioner) 12/18/2019  CHIEF COMPLAINT: F/u pancreas mass, fatigue, LUQ pain   CURRENT THERAPY: PENDING final diagnosis   INTERVAL HISTORY: Ms. Montrose returns as scheduled with her daughter. She is in a wheelchair. She is tired. Usually gets up early then goes back to bed after pain pill until pain abates. Pain is mostly in LUQ which radiates to back, and RUQ. Denies pelvic pain. Tolerates light house work and moves around at home. Takes oxycodone which is effective q5-6 hours. She is eating a little, taking supplements. She vomited twice, has not started zofran yet. Manages constipation with dulcolax, has BM q2-4 days. Urine is nor dark. Denies fever, chills, cough, chest pain, dyspnea, leg swelling, or new pain.    MEDICAL HISTORY:  Past Medical History:  Diagnosis Date  . Arthritis    both knees  . Bilirubinuria   . Endometrial cancer ([place]) dx'd 07/2019   surg only  . Family history of adverse reaction to anesthesia    hard to wake up  . Family history of cancer   . GERD (gastroesophageal reflux disease)   . Hypertension   . Mass of pancreas     SURGICAL HISTORY: Past Surgical History:  Procedure Laterality Date  . ABDOMINAL HYSTERECTOMY    . HERNIA REPAIR    . ROBOTIC ASSISTED TOTAL HYSTERECTOMY WITH BILATERAL SALPINGO OOPHERECTOMY Bilateral 07/29/2019   Procedure: ROBOTIC ASSISTED TOTAL HYSTERECTOMY WITH BILATERAL SALPINGO OOPHORECTOMY;  Surgeon: Everitt Amber, MD;  Location: [facility];  Service: Gynecology;  Laterality: Bilateral;  . SENTINEL NODE BIOPSY Bilateral 07/29/2019   Procedure: SENTINEL NODE BIOPSY;  Surgeon: Everitt Amber, MD;  Location: [facility];  Service: Gynecology;  Laterality: Bilateral;  . TUBAL LIGATION      I have reviewed the social history and family history with the patient and they are unchanged  from previous note.  ALLERGIES:  is allergic to penicillins; ibuprofen; other; ace inhibitors; and hydrochlorothiazide.  MEDICATIONS:  Current Outpatient Medications  Medication Sig Dispense Refill  . oxyCODONE (OXY IR/ROXICODONE) 5 MG immediate release tablet Take 1 tablet (5 mg total) by mouth every 6 (six) hours as needed for severe pain. 60 tablet 0  . HYDROcodone-acetaminophen (NORCO) 5-325 MG tablet Take 1 tablet by mouth every 6 (six) hours as needed for severe pain. (Patient not taking: Reported on 12/18/2019) 30 tablet 0  . ondansetron (ZOFRAN-ODT) 4 MG disintegrating tablet Take 1 tablet (4 mg total) by mouth every 8 (eight) hours as needed for nausea or vomiting. (Patient not taking: Reported on 12/18/2019) 30 tablet 0  . ranitidine (ZANTAC) 300 MG tablet Take 1 tablet (300 mg total) by mouth at bedtime. (Patient not taking: Reported on 12/01/2019) 30 tablet 2  . traMADol (ULTRAM) 50 MG tablet Take 1 tablet (50 mg total) by mouth every 8 (eight) hours as needed for severe pain. (Patient not taking: Reported on 12/18/2019) 30 tablet 0   No current facility-administered medications for this visit.    PHYSICAL EXAMINATION: ECOG PERFORMANCE STATUS: 2-3  Vitals:   12/18/19 1402  BP: (!) 144/85  Pulse: 97  Resp: 18  Temp: 97.8 F (36.6 C)  SpO2: 99%   Filed Weights   12/18/19 1402  Weight: 188 lb 4.8 oz (85.4 kg)    GENERAL:alert, no distress and comfortable SKIN: no rash  EYES:  sclera clear NECK:  without mass LUNGS: distant with normal breathing effort HEART: regular rate & rhythm, no lower extremity edema ABDOMEN:abdomen soft, non-tender and normal bowel sounds Musculoskeletal:no cyanosis of digits and no clubbing  NEURO: alert & oriented x 3 with fluent speech, no focal motor/sensory deficits  LABORATORY DATA:  I have reviewed the data as listed CBC Latest Ref Rng & Units 12/18/2019 12/16/2019 11/19/2019  WBC 4.0 - 10.5 K/uL 12.8(H) 10.9(H) 7.2  Hemoglobin 12.0 - 15.0 g/dL  12.2 11.8(L) 13.5  Hematocrit 36.0 - 46.0 % 37.5 36.7 41.1  Platelets 150 - 400 K/uL 535(H) 487(H) 272     CMP Latest Ref Rng & Units 12/18/2019 12/16/2019 11/19/2019  Glucose 70 - 99 mg/dL 117(H) 107(H) 102(H)  BUN 8 - 23 mg/dL 10 9 10   Creatinine 0.44 - 1.00 mg/dL 1.03(H) 1.08(H) 1.12(H)  Sodium 135 - 145 mmol/L 140 142 140  Potassium 3.5 - 5.1 mmol/L 4.4 4.2 4.0  Chloride 98 - 111 mmol/L 102 106 101  CO2 22 - 32 mmol/L 27 24 26   Calcium 8.9 - 10.3 mg/dL 10.2 9.8 10.0  Total Protein 6.5 - 8.1 g/dL 8.4(H) 7.7 7.5  Total Bilirubin 0.3 - 1.2 mg/dL 0.9 0.9 0.6  Alkaline Phos 38 - 126 U/L 526(H) 366(H) 130(H)  AST 15 - 41 U/L 249(HH) 92(H) 53(H)  ALT 0 - 44 U/L 119(H) 71(H) 21      RADIOGRAPHIC STUDIES: I have personally reviewed the radiological images as listed and agreed with the findings in the report. No results found.   ASSESSMENT & PLAN: 64 yo female with   1. Pancreatic mass with liver lesions -She presented with progressive LUQ pain, anorexia, and constipation. CT AP showed large mass in the pancreas tail and numerous liver lesions, overall highly suspicious for primary pancreas cancer with liver metastasis. images were previously reviewed  -she underwent liver biopsy for definitive diagnosis, unfortunately path is not back and no preliminary read yet. I discussed possibilities such as metastatic neuroendocrine tumor vs adenocarcinoma of the pancreas and possible treatment options. I feel metastatic gyn malignancy is less likely. CT chest is negative.  -CA 19-9 is pending from today. Labs show elevated WBC, PLT, and marked transaminitis. Bili is normal. This is likely from disease progression.  -she has persistent abdominal pain, mostly LUQ radiating to back, related to pancreas tail mass, and RUQ pain related to liver lesions. Tramadol and hydrocodone have not been enough. She is on oxycodone q6 hours now which is helpful. She manages constipation.  -Her PS is fair, I encouraged  her to increase activity and nutrition in the meantime. Declined IVF today -she is interested in low intensity chemotherapy to improve her symptoms and prolong her life. Will finalize treatment plan after path is determined.  -phone visit next week to review path and treatment plan   2. Abdominal pain, n/v, constipation, anorexia and weight loss, secondary to #1 -She presented with LUQ pain in 05/2019 that gradually worsened and radiates to back, likely related to the pancreatic tail mass -More recently developed RUQ pain, likely related to multiple liver lesions, consistent with metastasis  -She tried NSAIDS which are not helpful, also makes her itch so she takes it with benadryl. -pain is managed with oxycodone q6 PRN. Continue dulcolax for constipation -I encouraged her and her daughter to increase supplementsto improve nutrition.  -followed by dietician   3. Stage I FIGO grade 1 endometrioid carcinoma -presented with abdominal pain and PMB in 06/2019. It does not appear she had  imaging during work up -s/p total hyst with BSO and SLNB on 07/29/19 per Dr. Denman George -Path revealed endometrioid adenocarcinoma, FIGO grade 1, spanning 5.8 cm. Tumor invades less than one half of the myometrium. 0/3 SLNs with metastatic carcinoma, pT1aN0 -adjuvant therapy was not recommended -f/u with Dr. Denman George, last seen in 08/2019   4. Genetics -Due to her personal history of endometrial and (likely) pancreatic cancer, she qualifies for genetic testing to r/o pathogenic mutation such as BRCA which may have predisposed her to these cancers.  -she has 4 children and many grandchildren. She is interested and I referred her today -if BRCA mutation positive, and primary pancreatic adenocarcinoma is confirmed, she would be a candidate for PARP inhibitor in the future  -Genetic screening was drawn in lab on 12/09/19  5. Financial  -She does not work, takes care of her elderly mother who has dementia. She is uninsured.  She uses her mother's SS for food and bills. She currently lives in her mother's home -she has a daughter who lives nearby and helps out. The patient, her mother, and daughter are currently looking for a larger home to live in together and plan to move next week  -She can drive herself if needed -SW helping her with disability application  -Once her diagnosis and treatment plan are finalized, will apply for drug assistance  -she has a coupon for co-pay assistance at her pharmacy, meds are free   6. Goals of care -awaiting definitive diagnosis. We reviewed the aggressive nature of metastatic pancreatic adenocarcinoma in general.  -She is interested in lower intensity chemotherapy if it will improve symptoms and prolong her life  -full code   PLAN: -Reviewed symptom management  -Final path still pending  -Phone visit next week to review path and finalize treatment plan     No problem-specific Assessment & Plan notes found for this encounter.   Orders Placed This Encounter  Procedures  . Iron and TIBC    Standing Status:   Standing    Number of Occurrences:   20    Standing Expiration Date:   12/17/2020  . Ferritin    Standing Status:   Standing    Number of Occurrences:   20    Standing Expiration Date:   12/17/2020   All questions were answered. The patient knows to call the clinic with any problems, questions or concerns. No barriers to learning was detected. I spent 20 minutes counseling the patient face to face. The total time spent in the appointment was 30 minutes  and more than 50% was on counseling, reviewing test results, and and coordination of care      Alla Feeling, NP 12/18/19

## 2019-12-18 NOTE — Telephone Encounter (Signed)
Received critical lab call from lab [facility] [id number] made aware

## 2019-12-18 NOTE — Telephone Encounter (Signed)
Per Cira Rue NP called lab [phone] and spoke  With [facility] and asked her to add iron and ferritin for patient. Verdis Frederickson stated that she could.

## 2019-12-19 ENCOUNTER — Telehealth: Payer: Self-pay | Admitting: Nurse Practitioner

## 2019-12-19 LAB — FERRITIN: Ferritin: 1306 ng/mL — ABNORMAL HIGH (ref 11–307)

## 2019-12-19 LAB — IRON AND TIBC
Iron: 11 ug/dL — ABNORMAL LOW (ref 41–142)
Saturation Ratios: 6 % — ABNORMAL LOW (ref 21–57)
TIBC: 195 ug/dL — ABNORMAL LOW (ref 236–444)
UIBC: 184 ug/dL (ref 120–384)

## 2019-12-19 LAB — CANCER ANTIGEN 19-9: CA 19-9: 1428 U/mL — ABNORMAL HIGH (ref 0–35)

## 2019-12-19 NOTE — Telephone Encounter (Signed)
Scheduled appt per 2/4 los.  Spoke with pt and she is aware of her appt date and time.

## 2019-12-21 ENCOUNTER — Other Ambulatory Visit: Payer: Self-pay | Admitting: Hematology

## 2019-12-21 ENCOUNTER — Encounter: Payer: Self-pay | Admitting: Hematology

## 2019-12-21 DIAGNOSIS — C7B8 Other secondary neuroendocrine tumors: Secondary | ICD-10-CM

## 2019-12-21 HISTORY — DX: Other secondary neuroendocrine tumors: C7B.8

## 2019-12-22 ENCOUNTER — Ambulatory Visit: Payer: Self-pay | Admitting: Hematology

## 2019-12-22 ENCOUNTER — Other Ambulatory Visit: Payer: Self-pay | Admitting: Hematology

## 2019-12-22 LAB — SURGICAL PATHOLOGY

## 2019-12-22 NOTE — Progress Notes (Signed)
START ON PATHWAY REGIMEN - Neuroendocrine     A cycle is every 21 days:     Etoposide      Carboplatin   **Always confirm dose/schedule in your pharmacy ordering system**  Patient Characteristics: Pancreas, Poorly-differentiated, First Line Tumor Location: Pancreas AJCC M Category: M1 AJCC 8 Stage Grouping: IV AJCC T Category: T2 AJCC N Category: NX Line of therapy: First Line  Intent of Therapy: Non-Curative / Palliative Intent, Discussed with Patient

## 2019-12-23 ENCOUNTER — Telehealth (HOSPITAL_COMMUNITY): Payer: Self-pay | Admitting: General Practice

## 2019-12-23 NOTE — Telephone Encounter (Signed)
Hannah Decker is unable to reach patient to proceed w her application for disability.  Patients VM is full, [facility] asked that we contact patient when we see her to ask her to keep in touch w them so they can assist w filing for disability.  Edwyna Shell, LCSW Clinical Social Worker Phone:  [phone]

## 2019-12-24 ENCOUNTER — Inpatient Hospital Stay: Payer: Self-pay

## 2019-12-24 ENCOUNTER — Telehealth: Payer: Self-pay

## 2019-12-24 DIAGNOSIS — C259 Malignant neoplasm of pancreas, unspecified: Secondary | ICD-10-CM

## 2019-12-24 NOTE — Telephone Encounter (Signed)
I spoke with Ms Gaffke daughter [name], she states that Ms cosson is taking zofran now and is able to tolerated ensure.  I explained hospice services and Ms. Mermelstein has agreed to that.  Referral sent to [facility].  Patient is requesting refill for oxycodone

## 2019-12-25 ENCOUNTER — Telehealth: Payer: Self-pay

## 2019-12-25 ENCOUNTER — Inpatient Hospital Stay (HOSPITAL_BASED_OUTPATIENT_CLINIC_OR_DEPARTMENT_OTHER): Payer: Self-pay | Admitting: Hematology

## 2019-12-25 DIAGNOSIS — C7B8 Other secondary neuroendocrine tumors: Secondary | ICD-10-CM

## 2019-12-25 NOTE — Telephone Encounter (Signed)
Butch Penny from [facility] called stating Hannah Decker has decided she wants to discuss treatment.   [facility] will admit Hannah Decker to palliative care program 01/06/2020.

## 2019-12-25 NOTE — Progress Notes (Signed)
[place]   Telephone:([phone] Fax:(336) [phone]   Clinic Follow up Note   Patient Care Team: Gildardo Pounds, NP as PCP - General (Nurse Practitioner)   CHIEF COMPLAINT: F/u of Metastatic malignant poorly differentiated neuroendocrine tumor  SUMMARY OF ONCOLOGIC HISTORY: Oncology History  Endometrial ca [facility])  07/04/2019 Initial Biopsy   Diagnosis  Endometrium, biopsy - ENDOMETRIOID CARCINOMA - SEE COMMENT Microscopic Comment Based on the biopsy the carcinoma appears FIGO grade 1. Dr. Lyndon Code reviewed the case and agrees with the above diagnosis. Dr. Hulan Fray was notified of these results on July 07, 2019.   07/04/2019 Initial Diagnosis   Endometrial ca [facility])   07/29/2019 Pathology Results   DIAGNOSIS: 07/29/19  A. LYMPH NODES, SENTINEL, RIGHT OBURATOR, BIOPSY:  - One of one lymph nodes negative for carcinoma (0/1).   B.  LYMPH NODES, SENTINEL, RIGHT PRESACRAL, BIOPSY:  - One of one lymph nodes negative for carcinoma (0/1).   C. LYMPH NODES, SENTINEL, LEFT DEEP OBTURATOR, BIOPSY:  - One of one lymph nodes negative for carcinoma (0/1).   D. UTERUS, BILATERAL OVARIES AND FALLOPIANT TUBES, HYSTERECTOMY AND  BILATERAL  SALPINGO-OOPHORECTOMY:  - Uterus:       Endo/myometrium: Endometrioid adenocarcinoma, FIGO grade 1,  spanning 5.8 cm.            Tumor invades less than one half of the myometrium.     Metastatic malignant neuroendocrine tumor to liver ([place])  11/26/2019 Imaging   CT AP W contrast 11/26/19 IMPRESSION: 1. Hypoenhancing 8.8 cm in long axis pancreatic tail mass highly suspicious for pancreatic adenocarcinoma, with over 25 scattered enhancing liver lesions compatible metastatic disease to the liver. There is occlusion of the splenic vein with collateralization, poor definition of the splenic artery, and the mass is likely invading the left adrenal gland as well as abutting portions of the stomach and left renal vein. Likely malignant  mildly enlarged lymph node below the left renal vein. 2. Chronic appearing bilateral sacroiliitis. 3. Lumbar scoliosis with spondylosis and degenerative disc disease.   12/09/2019 Imaging   CT Chest WO contrast 12/09/19  IMPRESSION: 1. No findings of metastatic disease to the chest. 2. Large pancreatic tail mass as shown on recent dedicated CT abdomen. Numerous hypodense masses in the liver compatible with known metastatic lesions. 3. Thoracic spondylosis and dextroconvex thoracic scoliosis with rotary component. 4.  Aortic Atherosclerosis (ICD10-I70.0).   12/16/2019 Initial Biopsy   FINAL MICROSCOPIC DIAGNOSIS:   A. LIVER, LEFT, NEEDLE CORE BIOPSY:  - Consistent with poorly differentiated neuroendocrine carcinoma.   COMMENT:   Immunohistochemistry for Synaptophysin, Chromogranin, CD56 and P53 is  positive.  Ki-67 proliferation index reaches 60-70%.  The  immunophenotype is consistent with poorly differentiated neuroendocrine  carcinoma.  Additionally, tumor cells demonstrate positive staining for  CK7, CK20, TTF-1, CDX-2 and  PAX 8.  GATA-3 is negative.  The provided clinical history of a  pancreatic tail mass is noted.  The additional immunostains support  origin from the pancreas, but do not exclude origin from other entities.  Case discussed with Dr. Truitt Merle on 12/18/2019.  Intradepartmental  consultation (Dr. Vic Ripper).    12/21/2019 Initial Diagnosis   Metastatic malignant neuroendocrine tumor to liver [facility])   12/22/2019 Cancer Staging   Staging form: Liver, AJCC 8th Edition - Clinical: Stage IVB (cT1b, cNX, pM1) - Signed by Truitt Merle, MD on 12/22/2019   12/24/2019 -  Chemotherapy   The patient had palonosetron (ALOXI) injection 0.25 mg, 0.25 mg, Intravenous,  Once, 0 of 4 cycles pegfilgrastim-jmdb (FULPHILA) injection 6 mg, 6 mg, Subcutaneous,  Once, 0 of 4 cycles CARBOplatin (PARAPLATIN) 500 mg in sodium chloride 0.9 % 250 mL chemo infusion, 500 mg (original dose ),  Intravenous,  Once, 0 of 4 cycles Dose modification:   (Cycle 1) etoposide (VEPESID) 200 mg in sodium chloride 0.9 % 500 mL chemo infusion, 100 mg/m2 = 200 mg, Intravenous,  Once, 0 of 4 cycles fosaprepitant (EMEND) 150 mg in sodium chloride 0.9 % 145 mL IVPB, 150 mg, Intravenous,  Once, 0 of 4 cycles  for chemotherapy treatment.       CURRENT THERAPY:  Pending   INTERVAL HISTORY:  Hannah Decker is here for a follow up. She came in with her daughter today in a wheelchair.  She has been very fatigued lately, spend most of time in chair and bed at home.  She also has intermittent nausea and vomiting, improved with antiemetics.  She has low appetite, not eating much food, able to drink Ensure 2-3 bottles a day. Her abdominal pain is not controlled, she is taking oxycodone 5-6 times a day.  No fever or chill, review of system otherwise negative.  MEDICAL HISTORY:  Past Medical History:  Diagnosis Date  . Arthritis    both knees  . Bilirubinuria   . Endometrial cancer ([place]) dx'd 07/2019   surg only  . Family history of adverse reaction to anesthesia    hard to wake up  . Family history of cancer   . GERD (gastroesophageal reflux disease)   . Hypertension   . Mass of pancreas   . Metastatic malignant neuroendocrine tumor to liver [facility]) 12/21/2019    SURGICAL HISTORY: Past Surgical History:  Procedure Laterality Date  . ABDOMINAL HYSTERECTOMY    . HERNIA REPAIR    . ROBOTIC ASSISTED TOTAL HYSTERECTOMY WITH BILATERAL SALPINGO OOPHERECTOMY Bilateral 07/29/2019   Procedure: ROBOTIC ASSISTED TOTAL HYSTERECTOMY WITH BILATERAL SALPINGO OOPHORECTOMY;  Surgeon: Everitt Amber, MD;  Location: [facility];  Service: Gynecology;  Laterality: Bilateral;  . SENTINEL NODE BIOPSY Bilateral 07/29/2019   Procedure: SENTINEL NODE BIOPSY;  Surgeon: Everitt Amber, MD;  Location: [facility];  Service: Gynecology;  Laterality: Bilateral;  . TUBAL LIGATION      I have reviewed the social history and family history with  the patient and they are unchanged from previous note.  ALLERGIES:  is allergic to penicillins; ibuprofen; other; ace inhibitors; and hydrochlorothiazide.  MEDICATIONS:  Current Outpatient Medications  Medication Sig Dispense Refill  . HYDROcodone-acetaminophen (NORCO) 5-325 MG tablet Take 1 tablet by mouth every 6 (six) hours as needed for severe pain. (Patient not taking: Reported on 12/18/2019) 30 tablet 0  . ondansetron (ZOFRAN-ODT) 4 MG disintegrating tablet Take 1 tablet (4 mg total) by mouth every 8 (eight) hours as needed for nausea or vomiting. (Patient not taking: Reported on 12/18/2019) 30 tablet 0  . oxyCODONE (OXY IR/ROXICODONE) 5 MG immediate release tablet Take 1 tablet (5 mg total) by mouth every 6 (six) hours as needed for severe pain. 60 tablet 0  . ranitidine (ZANTAC) 300 MG tablet Take 1 tablet (300 mg total) by mouth at bedtime. (Patient not taking: Reported on 12/01/2019) 30 tablet 2  . traMADol (ULTRAM) 50 MG tablet Take 1 tablet (50 mg total) by mouth every 8 (eight) hours as needed for severe pain. (Patient not taking: Reported on 12/18/2019) 30 tablet 0   No current facility-administered medications for this visit.    PHYSICAL EXAMINATION: ECOG PERFORMANCE  STATUS: 3 - Symptomatic, >50% confined to bed  Chronic ill-appearing, in a wheelchair Bilateral breath sounds normal, no wheezing or rales  Regular heart rate Abdomen soft, (+) tenderness in upper left quadrant No leg edema    LABORATORY DATA:  I have reviewed the data as listed CBC Latest Ref Rng & Units 12/18/2019 12/16/2019 11/19/2019  WBC 4.0 - 10.5 K/uL 12.8(H) 10.9(H) 7.2  Hemoglobin 12.0 - 15.0 g/dL 12.2 11.8(L) 13.5  Hematocrit 36.0 - 46.0 % 37.5 36.7 41.1  Platelets 150 - 400 K/uL 535(H) 487(H) 272     CMP Latest Ref Rng & Units 12/18/2019 12/16/2019 11/19/2019  Glucose 70 - 99 mg/dL 117(H) 107(H) 102(H)  BUN 8 - 23 mg/dL 10 9 10   Creatinine 0.44 - 1.00 mg/dL 1.03(H) 1.08(H) 1.12(H)  Sodium 135 - 145 mmol/L  140 142 140  Potassium 3.5 - 5.1 mmol/L 4.4 4.2 4.0  Chloride 98 - 111 mmol/L 102 106 101  CO2 22 - 32 mmol/L 27 24 26   Calcium 8.9 - 10.3 mg/dL 10.2 9.8 10.0  Total Protein 6.5 - 8.1 g/dL 8.4(H) 7.7 7.5  Total Bilirubin 0.3 - 1.2 mg/dL 0.9 0.9 0.6  Alkaline Phos 38 - 126 U/L 526(H) 366(H) 130(H)  AST 15 - 41 U/L 249(HH) 92(H) 53(H)  ALT 0 - 44 U/L 119(H) 71(H) 21      RADIOGRAPHIC STUDIES: I have personally reviewed the radiological images as listed and agreed with the findings in the report. No results found.   ASSESSMENT & PLAN:  Hannah Decker is a 64 y.o. female with     1. Metastatic poorly differentiated malignant neuroendocrine tumor to liver  -She presented with progressive LUQ pain, anorexia, and constipation. Her 11/26/19 CT AP showed large mass in the pancreas tail and numerous liver lesions   -Her liver iopsy from 12/16/19 is consistent with poorly differentiated neuroendocrine carcinoma, large cell type. CT chest is negative.  -I discussed the aggressive nature of her malignancy, and overall very poor prognosis, her life expectancy is likely going to be a few months, less than 6 months  -I discussed the treatment options.  The large cell neuroendocrine tumor may respond to chemo, and I recommend her to consider chemotherapy with carboplatin and etoposide.  Potential benefit and side effects discussed with her, especially risk of infection, fatigue, nausea, etc. Pt is not very interested in chemo.  I discussed that she may not tolerate chemotherapy well due to her poor performance status.  I offered dose reduction if she wants to try. -We also discussed palliative care and hospice alone, to manage her symptoms at home.  We discussed the logistics of the hospice service, she will think about it -After lengthy discussion, she was not able to make the decision.  She was think about more, and let me know when she makes a decision. -I asked SW Lauren to see her after my visit     2. Abdominal pain, n/v, constipation, anorexia and weight loss, secondary to #1 -She is currently on oxycodone 5 mg every 6-8 hours, pain not controlled, I will start her on MS Contin 15 mg twice daily, and use oxycodone as needed for breakthrough pain. -I again reviewed the management of constipation -She will continue Zofran and Compazine as needed for nausea -Dietitian referral, will give her Ensure samples   3. Stage I FIGO grade 1 endometrioid carcinoma -presented with abdominal pain and PMB in 06/2019. It does not appear she had imaging during work up -s/p  total hyst with BSO and SLNB on 07/29/19 per Dr. Denman George -Path revealed endometrioid adenocarcinoma, FIGO grade 1, spanning 5.8 cm. Tumor invades less than one half of the myometrium. 0/3 SLNs with metastatic carcinoma, pT1aN0 -adjuvant therapy was not recommended -f/u with Dr. Denman George, last seen in 08/2019   4. Genetics -Due to her personal history of endometrial and (likely) pancreatic cancer, she qualifies for genetic testing to r/o pathogenic mutation such as BRCA which may have predisposed her to these cancers.  -she has 4 children and many grandchildren. She is interested and I referred her today -if BRCA mutation positive, and primary pancreatic adenocarcinoma is confirmed, she would be a candidate for PARP inhibitor in the future  -Genetic screening was drawn in lab on 12/09/19, results pending.   5. Financial  -She does not work, takes care of her elderly mother who has dementia. She is uninsured. She uses her mother's SS for food and bills. She currently lives in her mother's home -she has a daughter who lives nearby and helps out. The patient, her mother, and daughter are currently looking for a larger home to live in together.  -I referred her to SW for financial resources.  -Once her treatment plan are finalized, will apply for drug assistance  -she has a coupon for co-pay assistance at her pharmacy, meds are free    6. Goal of care discussion  -We again discussed the incurable nature of her cancer, and the overall poor prognosis, given the aggressive nature of her malignancy  -The patient understands the goal of care is palliative. -I recommend DNR/DNI, she will think about it    PLAN: -We discussed the option of chemotherapy with dose reduction, to start as soon as possible -I also discussed the option of hospice -she will think about it and will let me know     No problem-specific Assessment & Plan notes found for this encounter.   No orders of the defined types were placed in this encounter.  I spent a total of 40 mins for her visit today    Truitt Merle, MD 12/25/2019   I, Hannah Decker, am acting as scribe for Truitt Merle, MD.   I have reviewed the above documentation for accuracy and completeness, and I agree with the above.

## 2019-12-25 NOTE — Progress Notes (Signed)
[name]   [phone] [phone]   Clinic Follow up Note   Patient Care Team: Gildardo Pounds, NP as PCP - General (Nurse Practitioner)  Date of Service:  12/26/2019  CHIEF COMPLAINT: F/u of Metastatic malignant neuroendocrine tumor  SUMMARY OF ONCOLOGIC HISTORY: Oncology History  Endometrial ca [facility])  07/04/2019 Initial Biopsy   Diagnosis  Endometrium, biopsy - ENDOMETRIOID CARCINOMA - SEE COMMENT Microscopic Comment Based on the biopsy the carcinoma appears FIGO grade 1. Dr. Lyndon Code reviewed the case and agrees with the above diagnosis. Dr. Hulan Fray was notified of these results on July 07, 2019.   07/04/2019 Initial Diagnosis   Endometrial ca [facility])   07/29/2019 Pathology Results   DIAGNOSIS: 07/29/19  A. LYMPH NODES, SENTINEL, RIGHT OBURATOR, BIOPSY:  - One of one lymph nodes negative for carcinoma (0/1).   B.  LYMPH NODES, SENTINEL, RIGHT PRESACRAL, BIOPSY:  - One of one lymph nodes negative for carcinoma (0/1).   C. LYMPH NODES, SENTINEL, LEFT DEEP OBTURATOR, BIOPSY:  - One of one lymph nodes negative for carcinoma (0/1).   D. UTERUS, BILATERAL OVARIES AND FALLOPIANT TUBES, HYSTERECTOMY AND  BILATERAL  SALPINGO-OOPHORECTOMY:  - Uterus:       Endo/myometrium: Endometrioid adenocarcinoma, FIGO grade 1,  spanning 5.8 cm.            Tumor invades less than one half of the myometrium.     Metastatic malignant neuroendocrine tumor to liver ([place])  11/26/2019 Imaging   CT AP W contrast 11/26/19 IMPRESSION: 1. Hypoenhancing 8.8 cm in long axis pancreatic tail mass highly suspicious for pancreatic adenocarcinoma, with over 25 scattered enhancing liver lesions compatible metastatic disease to the liver. There is occlusion of the splenic vein with collateralization, poor definition of the splenic artery, and the mass is likely invading the left adrenal gland as well as abutting portions of the stomach and left renal vein. Likely  malignant mildly enlarged lymph node below the left renal vein. 2. Chronic appearing bilateral sacroiliitis. 3. Lumbar scoliosis with spondylosis and degenerative disc disease.   12/09/2019 Imaging   CT Chest WO contrast 12/09/19  IMPRESSION: 1. No findings of metastatic disease to the chest. 2. Large pancreatic tail mass as shown on recent dedicated CT abdomen. Numerous hypodense masses in the liver compatible with known metastatic lesions. 3. Thoracic spondylosis and dextroconvex thoracic scoliosis with rotary component. 4.  Aortic Atherosclerosis (ICD10-I70.0).   12/16/2019 Initial Biopsy   FINAL MICROSCOPIC DIAGNOSIS:   A. LIVER, LEFT, NEEDLE CORE BIOPSY:  - Consistent with poorly differentiated neuroendocrine carcinoma.   COMMENT:   Immunohistochemistry for Synaptophysin, Chromogranin, CD56 and P53 is  positive.  Ki-67 proliferation index reaches 60-70%.  The  immunophenotype is consistent with poorly differentiated neuroendocrine  carcinoma.  Additionally, tumor cells demonstrate positive staining for  CK7, CK20, TTF-1, CDX-2 and  PAX 8.  GATA-3 is negative.  The provided clinical history of a  pancreatic tail mass is noted.  The additional immunostains support  origin from the pancreas, but do not exclude origin from other entities.  Case discussed with Dr. Truitt Merle on 12/18/2019.  Intradepartmental  consultation (Dr. Vic Ripper).    12/21/2019 Initial Diagnosis   Metastatic malignant neuroendocrine tumor to liver [facility])   12/22/2019 Cancer Staging   Staging form: Liver, AJCC 8th Edition - Clinical: Stage IVB (cT1b, cNX, pM1) - Signed by Truitt Merle, MD on 12/22/2019      CURRENT THERAPY:  PENDING Carboplatin and Etopside every 3 weeks, vs hospice  care    INTERVAL HISTORY:  Hannah Decker is here for a follow up. She presents to the clinic with her daughter. She came in a wheelchair today.  She is quite fatigued.  Her main concern is her left upper abdominal pain, radiates  to the back.  She rates at 7-9/10, for which she takes oxycodone 5 mg every 5-6 hours.  She feels her pain is not adequately controlled.  Her appetite is low, her abdominal pain gets worse after eating.  She is eating small meals, and drinks Ensure, not sure how much she drinks, probably not adequate.  She has been very fatigued, spent most time in chair in the bed. She has intermittent nausea, she takes Compazine which helps.  No leg edema, dyspnea  Or other symptoms. ROS otherwise negative.    MEDICAL HISTORY:  Past Medical History:  Diagnosis Date  . Arthritis    both knees  . Bilirubinuria   . Endometrial cancer ([place]) dx'd 07/2019   surg only  . Family history of adverse reaction to anesthesia    hard to wake up  . Family history of cancer   . GERD (gastroesophageal reflux disease)   . Hypertension   . Mass of pancreas   . Metastatic malignant neuroendocrine tumor to liver [facility]) 12/21/2019    SURGICAL HISTORY: Past Surgical History:  Procedure Laterality Date  . ABDOMINAL HYSTERECTOMY    . HERNIA REPAIR    . ROBOTIC ASSISTED TOTAL HYSTERECTOMY WITH BILATERAL SALPINGO OOPHERECTOMY Bilateral 07/29/2019   Procedure: ROBOTIC ASSISTED TOTAL HYSTERECTOMY WITH BILATERAL SALPINGO OOPHORECTOMY;  Surgeon: Everitt Amber, MD;  Location: [facility];  Service: Gynecology;  Laterality: Bilateral;  . SENTINEL NODE BIOPSY Bilateral 07/29/2019   Procedure: SENTINEL NODE BIOPSY;  Surgeon: Everitt Amber, MD;  Location: [facility];  Service: Gynecology;  Laterality: Bilateral;  . TUBAL LIGATION      I have reviewed the social history and family history with the patient and they are unchanged from previous note.  ALLERGIES:  is allergic to penicillins; ibuprofen; other; ace inhibitors; and hydrochlorothiazide.  MEDICATIONS:  Current Outpatient Medications  Medication Sig Dispense Refill  . oxyCODONE (OXY IR/ROXICODONE) 5 MG immediate release tablet Take 1 tablet (5 mg total) by mouth every 6 (six) hours as  needed for severe pain. 60 tablet 0  . HYDROcodone-acetaminophen (NORCO) 5-325 MG tablet Take 1 tablet by mouth every 6 (six) hours as needed for severe pain. (Patient not taking: Reported on 12/18/2019) 30 tablet 0  . morphine (MS CONTIN) 15 MG 12 hr tablet Take 1 tablet (15 mg total) by mouth every 12 (twelve) hours. 30 tablet 0  . ondansetron (ZOFRAN-ODT) 4 MG disintegrating tablet Take 1 tablet (4 mg total) by mouth every 8 (eight) hours as needed for nausea or vomiting. (Patient not taking: Reported on 12/18/2019) 30 tablet 0  . prochlorperazine (COMPAZINE) 10 MG tablet Take 1 tablet (10 mg total) by mouth every 6 (six) hours as needed for nausea or vomiting. 30 tablet 0  . ranitidine (ZANTAC) 300 MG tablet Take 1 tablet (300 mg total) by mouth at bedtime. (Patient not taking: Reported on 12/01/2019) 30 tablet 2  . traMADol (ULTRAM) 50 MG tablet Take 1 tablet (50 mg total) by mouth every 8 (eight) hours as needed for severe pain. (Patient not taking: Reported on 12/18/2019) 30 tablet 0   No current facility-administered medications for this visit.    PHYSICAL EXAMINATION: ECOG PERFORMANCE STATUS: 3 - Symptomatic, >50% confined to bed  Vitals:  12/26/19 1200  BP: (!) 142/83  Pulse: (!) 102  Resp: 18  Temp: 98.3 F (36.8 C)  SpO2: 100%   Filed Weights   12/26/19 1200  Weight: 186 lb 12.8 oz (84.7 kg)    GENERAL:alert, oriented, in moderate distress due to her pain SKIN: skin color, texture, turgor are normal, no rashes or significant lesions EYES: normal, Conjunctiva are pink and non-injected, sclera clear NECK: supple, thyroid normal size, non-tender, without nodularity LYMPH:  no palpable lymphadenopathy in the cervical, axillary  LUNGS: clear to auscultation and percussion with normal breathing effort HEART: regular rate & rhythm and no murmurs and no lower extremity edema ABDOMEN:abdomen soft, mild tenderness in epigastric area, normal bowel sounds Musculoskeletal:no cyanosis of  digits and no clubbing  NEURO: alert & oriented x 3 with fluent speech, no focal motor/sensory deficits  LABORATORY DATA:  I have reviewed the data as listed CBC Latest Ref Rng & Units 12/26/2019 12/18/2019 12/16/2019  WBC 4.0 - 10.5 K/uL 10.8(H) 12.8(H) 10.9(H)  Hemoglobin 12.0 - 15.0 g/dL 11.3(L) 12.2 11.8(L)  Hematocrit 36.0 - 46.0 % 34.3(L) 37.5 36.7  Platelets 150 - 400 K/uL 598(H) 535(H) 487(H)     CMP Latest Ref Rng & Units 12/26/2019 12/18/2019 12/16/2019  Glucose 70 - 99 mg/dL 102(H) 117(H) 107(H)  BUN 8 - 23 mg/dL 13 10 9   Creatinine 0.44 - 1.00 mg/dL 0.79 1.03(H) 1.08(H)  Sodium 135 - 145 mmol/L 139 140 142  Potassium 3.5 - 5.1 mmol/L 3.8 4.4 4.2  Chloride 98 - 111 mmol/L 102 102 106  CO2 22 - 32 mmol/L 26 27 24   Calcium 8.9 - 10.3 mg/dL 10.3 10.2 9.8  Total Protein 6.5 - 8.1 g/dL 7.8 8.4(H) 7.7  Total Bilirubin 0.3 - 1.2 mg/dL 0.7 0.9 0.9  Alkaline Phos 38 - 126 U/L 580(H) 526(H) 366(H)  AST 15 - 41 U/L 119(H) 249(HH) 92(H)  ALT 0 - 44 U/L 92(H) 119(H) 71(H)      RADIOGRAPHIC STUDIES: I have personally reviewed the radiological images as listed and agreed with the findings in the report. No results found.   ASSESSMENT & PLAN:  Hannah Decker is a 64 y.o. female with    1.  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 Her 11/26/19 CT AP showed large mass in the pancreas tail and numerous liver lesions, overall highly suspicious for primary pancreas cancer with liver metastasis.   -Her liver biopsy from 12/16/19 is consistent with poorly differentiated neuroendocrine carcinoma, large cell type. CT chest is negative.  -I reviewed the incurable nature of her malignancy, and overall very poor prognosis due to the aggressive nature of the disease. Her life expectancy is likely a few months to 6 months.  -I discussed the treatment option, which is largely  chemotherapy, I recommend first-line carboplatin and etoposide every 3 weeks.  The potential benefit and side effects discussed with patient and her daughter in details.  Due to her poor performance status, I would recommend dose reduction for first cycle. -Due to her poor performance status, palliative care and hospice alone is also very reasonable, her daughter is agreeable, but pt is not ready.  -After lengthy discussion, patient is not able to make a decision at this point, I encouraged her to think about it and discuss with her children over the weekend.  If she wants to try chemotherapy, I encouraged her to start next week. -  I offered IVF, she declined   2. Abdominal pain, n/v, constipation, anorexia and weight loss, secondary to #1 -She presented with LUQ pain in 05/2019 that gradually worsened and radiates to back, likely related to the pancreatic tail mass -More recently developed RUQ pain, likely related to multiple liver lesions, consistent with metastasis  -Her pain is not very well controlled, she is on oxycodone 5 mg every 6 hours as needed, I will add on MS Contin 15 mg twice daily.  Constipation management reviewed with her.  She agrees.   3. Stage I FIGO grade 1 endometrioid carcinoma -presented with abdominal pain and PMB in 06/2019. It does not appear she had imaging during work up -s/p total hyst with BSO and SLNB on 07/29/19 per Dr. Denman George -Path revealed endometrioid adenocarcinoma, FIGO grade 1, spanning 5.8 cm. Tumor invades less than one half of the myometrium. 0/3 SLNs with metastatic carcinoma, pT1aN0 -adjuvant therapy was not recommended -f/u with Dr. Denman George, last seen in 08/2019   4. Genetics -Due to her personal history of endometrial and (likely) pancreatic cancer, she qualifies for genetic testing to r/o pathogenic mutation such as BRCA which may have predisposed her to these cancers.  -she has 4 children and many grandchildren. She is interested and I referred her  today -if BRCA mutation positive, and primary pancreatic adenocarcinoma is confirmed, she would be a candidate for PARP inhibitor in the future  -Genetic screening was drawn in lab on 12/09/19, results pending.   5. Financial  -She does not work, takes care of her elderly mother who has dementia. She is uninsured. She uses her mother's SS for food and bills. She currently lives in her mother's home -she has a daughter who lives nearby and helps out. The patient, her mother, and daughter are currently looking for a larger home to live in together.  -She can drive herself if needed -I referred her to SW for financial resources.  -Once her diagnosis and treatment plan are finalized, will apply for drug assistance  -she has a coupon for co-pay assistance at her pharmacy, meds are free   6. Goals of care -We reviewed the aggressive nature of her metastatic cancer, and has a goal of care is palliation -I recommend DNR, she will think about it.  PLAN: -Patient will think about chemotherapy versus hospice, and let me know about her decision early next week. -I talked to social worker Lauren, she met with pt and gave her Ensure  -I will f/u on Monday      No problem-specific Assessment & Plan notes found for this encounter.   No orders of the defined types were placed in this encounter.  All questions were answered. The patient knows to call the clinic with any problems, questions or concerns. No barriers to learning was detected. The total time spent in the appointment was 40 minutes.     Truitt Merle, MD 12/26/2019   I, Joslyn Devon, am acting as scribe for Truitt Merle, MD.   I have reviewed the above documentation for accuracy and completeness, and I agree with the above.

## 2019-12-26 ENCOUNTER — Other Ambulatory Visit: Payer: Self-pay

## 2019-12-26 ENCOUNTER — Telehealth: Payer: Self-pay | Admitting: Hematology

## 2019-12-26 ENCOUNTER — Inpatient Hospital Stay (HOSPITAL_BASED_OUTPATIENT_CLINIC_OR_DEPARTMENT_OTHER): Payer: Self-pay | Admitting: Hematology

## 2019-12-26 ENCOUNTER — Inpatient Hospital Stay: Payer: Self-pay

## 2019-12-26 ENCOUNTER — Encounter: Payer: Self-pay | Admitting: Hematology

## 2019-12-26 ENCOUNTER — Encounter: Payer: Self-pay | Admitting: *Deleted

## 2019-12-26 VITALS — BP 142/83 | HR 102 | Temp 98.3°F | Resp 18 | Ht 66.5 in | Wt 186.8 lb

## 2019-12-26 DIAGNOSIS — C7B8 Other secondary neuroendocrine tumors: Secondary | ICD-10-CM

## 2019-12-26 DIAGNOSIS — K8689 Other specified diseases of pancreas: Secondary | ICD-10-CM

## 2019-12-26 LAB — CBC WITH DIFFERENTIAL (CANCER CENTER ONLY)
Abs Immature Granulocytes: 0.07 10*3/uL (ref 0.00–0.07)
Basophils Absolute: 0 10*3/uL (ref 0.0–0.1)
Basophils Relative: 0 %
Eosinophils Absolute: 0 10*3/uL (ref 0.0–0.5)
Eosinophils Relative: 0 %
HCT: 34.3 % — ABNORMAL LOW (ref 36.0–46.0)
Hemoglobin: 11.3 g/dL — ABNORMAL LOW (ref 12.0–15.0)
Immature Granulocytes: 1 %
Lymphocytes Relative: 8 %
Lymphs Abs: 0.8 10*3/uL (ref 0.7–4.0)
MCH: 25.2 pg — ABNORMAL LOW (ref 26.0–34.0)
MCHC: 32.9 g/dL (ref 30.0–36.0)
MCV: 76.4 fL — ABNORMAL LOW (ref 80.0–100.0)
Monocytes Absolute: 1.3 10*3/uL — ABNORMAL HIGH (ref 0.1–1.0)
Monocytes Relative: 12 %
Neutro Abs: 8.6 10*3/uL — ABNORMAL HIGH (ref 1.7–7.7)
Neutrophils Relative %: 79 %
Platelet Count: 598 10*3/uL — ABNORMAL HIGH (ref 150–400)
RBC: 4.49 MIL/uL (ref 3.87–5.11)
RDW: 15.6 % — ABNORMAL HIGH (ref 11.5–15.5)
WBC Count: 10.8 10*3/uL — ABNORMAL HIGH (ref 4.0–10.5)
nRBC: 0 % (ref 0.0–0.2)

## 2019-12-26 LAB — CMP (CANCER CENTER ONLY)
ALT: 92 U/L — ABNORMAL HIGH (ref 0–44)
AST: 119 U/L — ABNORMAL HIGH (ref 15–41)
Albumin: 2.6 g/dL — ABNORMAL LOW (ref 3.5–5.0)
Alkaline Phosphatase: 580 U/L — ABNORMAL HIGH (ref 38–126)
Anion gap: 11 (ref 5–15)
BUN: 13 mg/dL (ref 8–23)
CO2: 26 mmol/L (ref 22–32)
Calcium: 10.3 mg/dL (ref 8.9–10.3)
Chloride: 102 mmol/L (ref 98–111)
Creatinine: 0.79 mg/dL (ref 0.44–1.00)
GFR, Est AFR Am: >60 mL/min (ref >60)
GFR, Estimated: >60 mL/min (ref >60)
Glucose, Bld: 102 mg/dL — ABNORMAL HIGH (ref 70–99)
Potassium: 3.8 mmol/L (ref 3.5–5.1)
Sodium: 139 mmol/L (ref 135–145)
Total Bilirubin: 0.7 mg/dL (ref 0.3–1.2)
Total Protein: 7.8 g/dL (ref 6.5–8.1)

## 2019-12-26 MED ORDER — MORPHINE SULFATE ER 15 MG PO TBCR: 15.0000 mg | EXTENDED_RELEASE_TABLET | Freq: Two times a day (BID) | ORAL | 0 refills | Status: AC

## 2019-12-26 MED ORDER — OXYCODONE HCL 5 MG PO TABS: 5.0000 mg | ORAL_TABLET | Freq: Four times a day (QID) | ORAL | 0 refills | Status: AC | PRN

## 2019-12-26 MED ORDER — PROCHLORPERAZINE MALEATE 10 MG PO TABS: 10.0000 mg | ORAL_TABLET | Freq: Four times a day (QID) | ORAL | 0 refills | Status: AC | PRN

## 2019-12-26 MED FILL — MORPHINE SULF ER 15 MG TAB: 15 | 15 days supply | Qty: 30 | Fill #0

## 2019-12-26 MED FILL — PROCHLORPERAZINE 10 MG TAB: 10 | 7 days supply | Qty: 30 | Fill #0

## 2019-12-26 MED FILL — oxyCODONE HCL 5 MG TABS: 5 | 15 days supply | Qty: 60 | Fill #0

## 2019-12-26 NOTE — Progress Notes (Signed)
[place] Work  Clinical Social Work was referred by Fut[employer] trader for follow up on disability process.  CSW contacted [facility]- they reported they have made multiple attempts to reach patient to complete disability interview.  CSW notified patient's daughter and appointment was rescheduled for 2/18 at 2pm phone interview.   CSW explained patient would need to contact DSS to complete Medicaid application.  Patient's daughter plans to assist with that process. CSW provided complimentary case of Ensure and will notify RD team.   Gwinda Maine, [place]

## 2019-12-26 NOTE — Telephone Encounter (Signed)
No los per 2/11. 

## 2019-12-28 ENCOUNTER — Encounter: Payer: Self-pay | Admitting: Hematology

## 2019-12-29 ENCOUNTER — Telehealth: Payer: Self-pay

## 2019-12-29 NOTE — Telephone Encounter (Signed)
Per Dr. Burr Medico TC  To Pt. To follow up on conversation discussed with Dr Burr Medico at visit last week Pt decided not to have anymore Chemo hospice referral to be faxed to [facility].

## 2020-01-06 ENCOUNTER — Other Ambulatory Visit: Payer: Self-pay

## 2020-01-06 ENCOUNTER — Other Ambulatory Visit: Payer: Self-pay | Admitting: Hematology

## 2020-01-06 ENCOUNTER — Other Ambulatory Visit: Payer: Self-pay | Admitting: Internal Medicine

## 2020-01-06 DIAGNOSIS — C7B8 Other secondary neuroendocrine tumors: Secondary | ICD-10-CM

## 2020-01-06 DIAGNOSIS — Z515 Encounter for palliative care: Secondary | ICD-10-CM

## 2020-01-07 ENCOUNTER — Telehealth: Payer: Self-pay | Admitting: Genetic Counselor

## 2020-01-07 NOTE — Telephone Encounter (Signed)
Revealed negative genetic testing to Hannah Decker and her daughter, [name].  Discussed that we do not know why she has cancer or why there is cancer in the family.  It could be due to a different gene that we are not testing, or our current technology may not be able detect something.  It will be a good idea for her and/or her family members to keep in contact with genetics to keep up with whether additional testing may be appropriate in the future.

## 2020-01-09 ENCOUNTER — Encounter: Payer: Self-pay | Admitting: General Practice

## 2020-01-09 ENCOUNTER — Encounter: Payer: Self-pay | Admitting: Hematology

## 2020-01-09 ENCOUNTER — Ambulatory Visit: Payer: Self-pay | Admitting: Genetic Counselor

## 2020-01-09 ENCOUNTER — Encounter: Payer: Self-pay | Admitting: Genetic Counselor

## 2020-01-09 DIAGNOSIS — Z7189 Other specified counseling: Secondary | ICD-10-CM | POA: Insufficient documentation

## 2020-01-09 DIAGNOSIS — Z1379 Encounter for other screening for genetic and chromosomal anomalies: Secondary | ICD-10-CM | POA: Insufficient documentation

## 2020-01-09 NOTE — Progress Notes (Signed)
[place] CSW Progress Notes  Per email from [employer], [employer] submitted patient's application for Social Security disability today.  Edwyna Shell, LCSW Clinical Social Worker Phone:  [phone] Cell:  [phone]
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
## 2020-01-10 NOTE — Progress Notes (Signed)
Erroneous note.  Disregard    Pegah Segel, NP-C 

## 2020-01-10 NOTE — Progress Notes (Signed)
[facility] Consult Note Telephone: [phone]  Fax: [phone]  PATIENT NAME: Hannah Decker DOB: [birth year] MRN: [id number]  PRIMARY CARE PROVIDER:   Gildardo Pounds, NP  REFERRING PROVIDER:  Dr. Burr Medico  RESPONSIBLE PARTY:   Self and daughter  Jorja Loa   Daughter [phone]       RECOMMENDATIONS and PLAN:  Palliative Care Encounter  Z51.5  1.  Advance care planning:  Clarification of immediate plans with daughter [name] and patient. Medical record review notes that patient has decided to forgo any additional chemotherapy treatments and prefers to transition to comfort care with Hospice. Advanced directives were also discussed and she was only able to request DNAR.  Same document was completed and left with patient and daughter for display within her home.  MOST form was reviewed but no selection made today. The Hospice nurse can readdress MOST form for completion.  Confirmed with medical staff at Dr. Ernestina Penna office that patient had been referred to [facility] due to her terminal illness.  [facility] referral [facility] and Dr. Tomasa Hosteller were notified of change of plans for patient care and recommendation for comfort care only.  The referral center will contact pt and daughter for admission arrangements.   2.  Cancer related pain:  Continue current regimen with MS Contin and Oxycodone.  Additional management with Hospice care.  3.  Weakness:  Related to terminal disease, weight loss and poor nutritional intake.  Comfort feedings and hydration. Increase rest breaks as needed.  I spent 45 minutes providing this consultation,  from 1230 to 1315. More than 50% of the time in this consultation was spent coordinating communication with patient, daughter and  review of medical records.   HISTORY OF PRESENT ILLNESS:  [name] is a 64 y.o. year old female with multiple medical problems including hx of endometrial cancer, metastatic malignant  neuroendocrine tumor to liver and pancreas, weight loss. Patient was previously receiving receiving chemotherapy treatments which she has decided to forgo any additional treatments due to disease progression. She reports weakness/fatigue, poor appetite, chronic abdomen pain.  Her daughter assists with her personal needs.   Palliative Care was asked to help address goals of care.   CODE STATUS: DNAR   PPS: 40% weak  HOSPICE ELIGIBILITY/DIAGNOSIS: YES/ Metastatic malignant neuroendocrine tumor to liver and pancreas.  No plans for continued chemotherapy  PAST MEDICAL HISTORY:  Past Medical History:  Diagnosis Date  . Arthritis    both knees  . Bilirubinuria   . Endometrial cancer ([place]) dx'd 07/2019   surg only  . Family history of adverse reaction to anesthesia    hard to wake up  . Family history of cancer   . GERD (gastroesophageal reflux disease)   . Hypertension   . Mass of pancreas   . Metastatic malignant neuroendocrine tumor to liver [facility]) 12/21/2019      PERTINENT MEDICATIONS:  Outpatient Encounter Medications as of 01/06/2020  Medication Sig  . HYDROcodone-acetaminophen (NORCO) 5-325 MG tablet Take 1 tablet by mouth every 6 (six) hours as needed for severe pain. (Patient not taking: Reported on 12/18/2019)  . morphine (MS CONTIN) 15 MG 12 hr tablet Take 1 tablet (15 mg total) by mouth every 12 (twelve) hours.  . ondansetron (ZOFRAN-ODT) 4 MG disintegrating tablet Take 1 tablet (4 mg total) by mouth every 8 (eight) hours as needed for nausea or vomiting. (Patient not taking: Reported on 12/18/2019)  . oxyCODONE (OXY IR/ROXICODONE) 5 MG immediate  release tablet Take 1 tablet (5 mg total) by mouth every 6 (six) hours as needed for severe pain.  Marland Kitchen prochlorperazine (COMPAZINE) 10 MG tablet Take 1 tablet (10 mg total) by mouth every 6 (six) hours as needed for nausea or vomiting.  . ranitidine (ZANTAC) 300 MG tablet Take 1 tablet (300 mg total) by mouth at bedtime. (Patient not taking:  Reported on 12/01/2019)  . traMADol (ULTRAM) 50 MG tablet Take 1 tablet (50 mg total) by mouth every 8 (eight) hours as needed for severe pain. (Patient not taking: Reported on 12/18/2019)   No facility-administered encounter medications on file as of 01/06/2020.    PHYSICAL EXAM:   General: Chronically ill and frail appearing, thin Cardiovascular: regular rate and rhythm Pulmonary: no increased respiratory effort Extremities: no edema, no joint deformities Skin: exposed skin is intact Neurological: A&O x3.  Weakness  Psych:  Flat affect. Smiled intermitently  Gonzella Lex, NP-C

## 2020-02-12 DEATH — deceased

## 2021-08-30 IMAGING — CT CT CHEST W/O CM
2 of 4 series · 15 of 36 positions shown, 18 images · non-contrast
Comparison: CT abdomen 11/26/2019

CLINICAL DATA: Staging pancreatic cancer

EXAM:
CT CHEST WITHOUT CONTRAST
TECHNIQUE: Multidetector CT imaging of the chest was performed following the
standard protocol without IV contrast.

[Series 2: thorax · axial · 0.70mm/px · z∈[-28,+224]mm · 12 of 148 slices shown, 15 images]
[im 11/148  mediastinal]
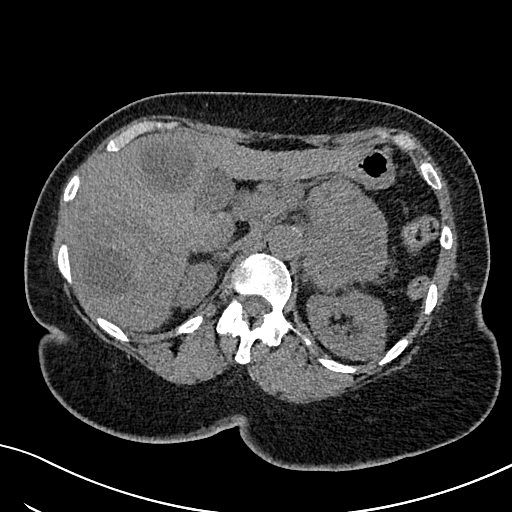
[im 11/148  lung]
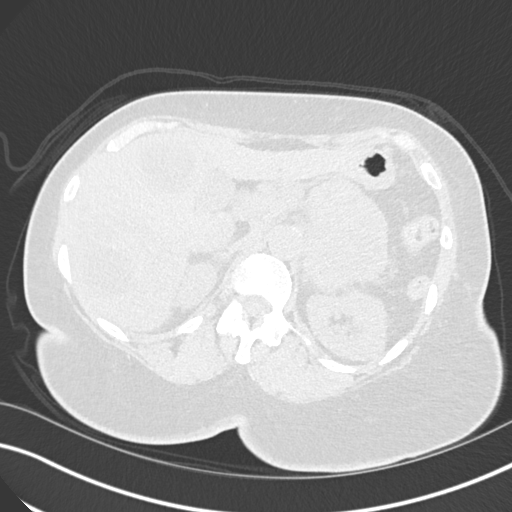
[im 22/148  lung]
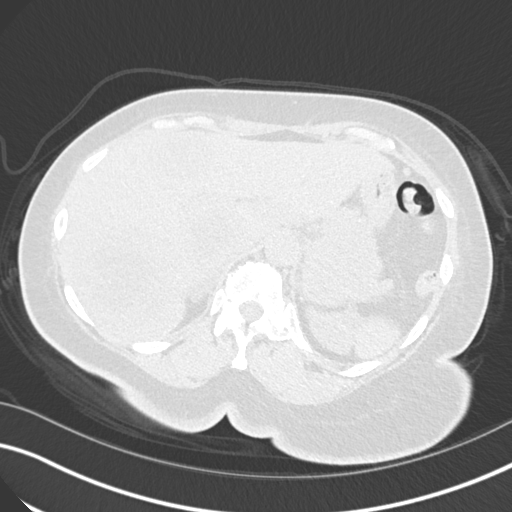
[im 32/148  lung]
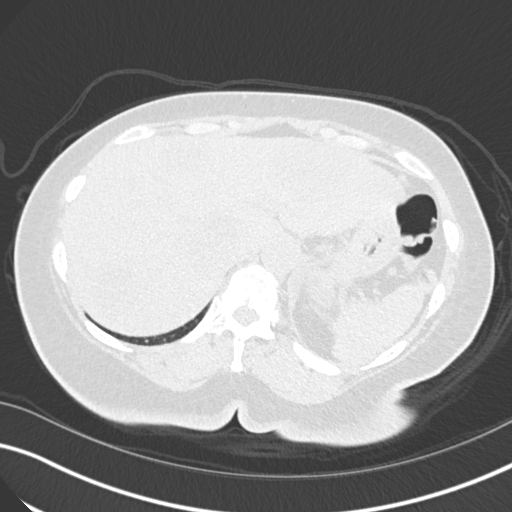
[im 43/148  lung]
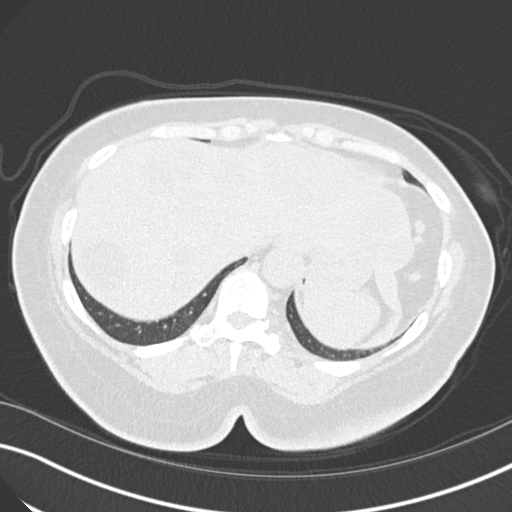
[im 53/148  mediastinal]
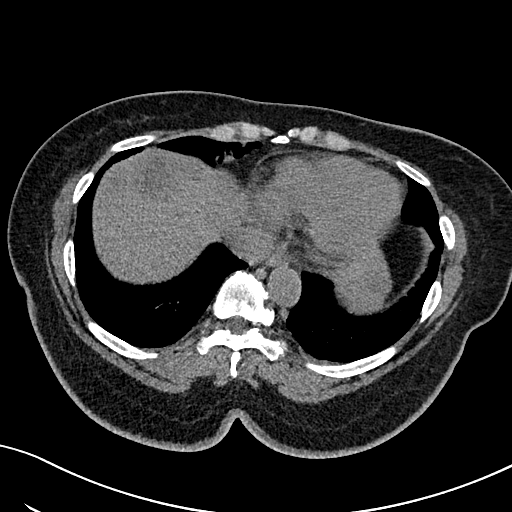
[im 53/148  lung]
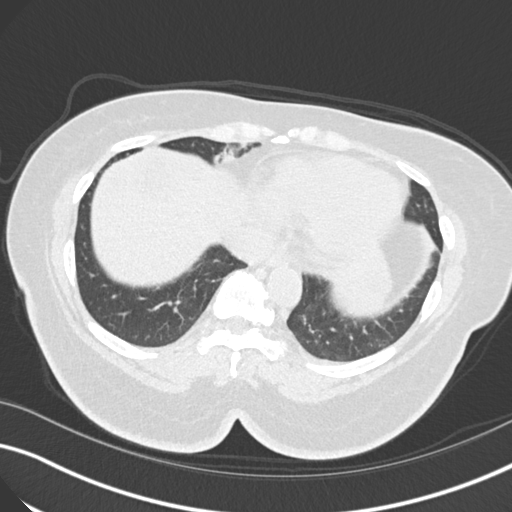
[im 64/148  lung]
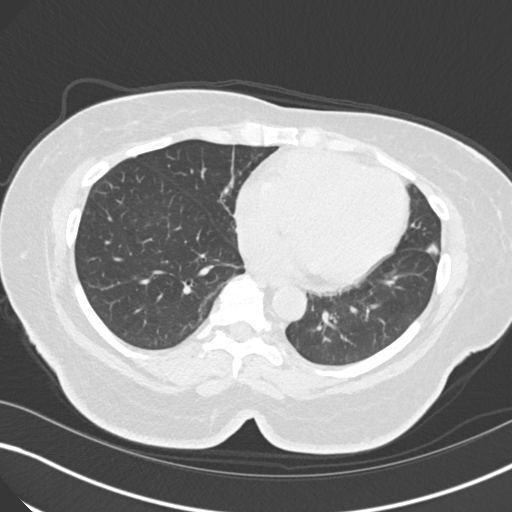
[im 85/148  lung]
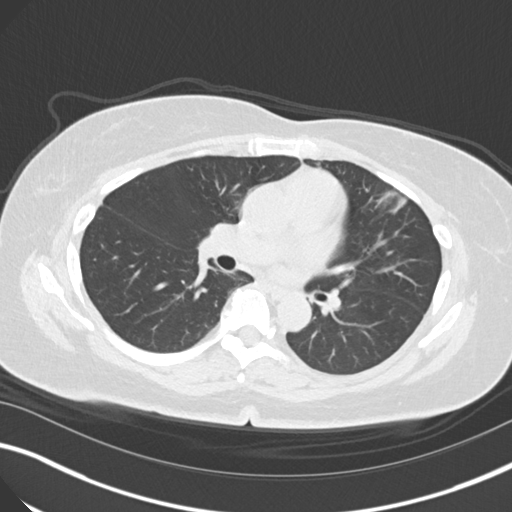
[im 95/148  lung]
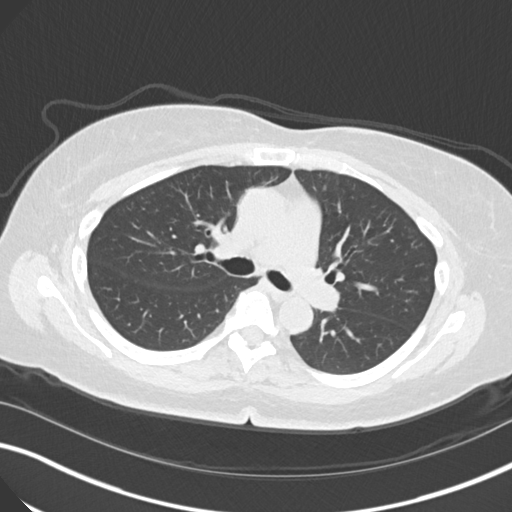
[im 106/148  mediastinal]
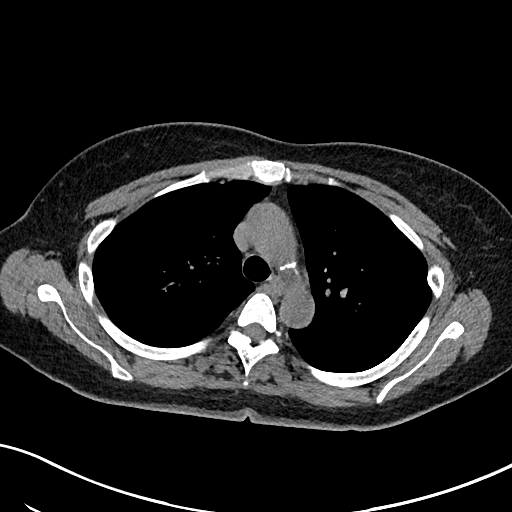
[im 106/148  lung]
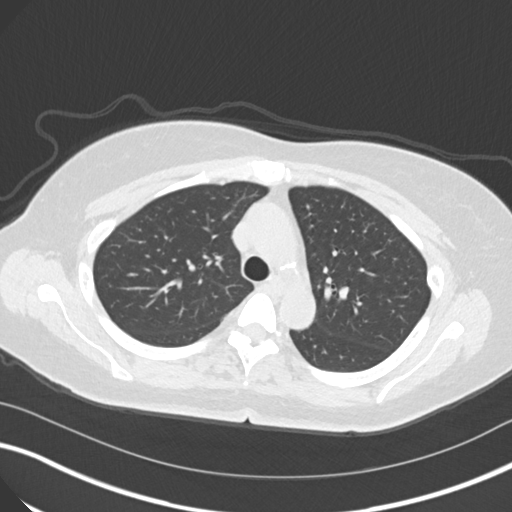
[im 116/148  lung]
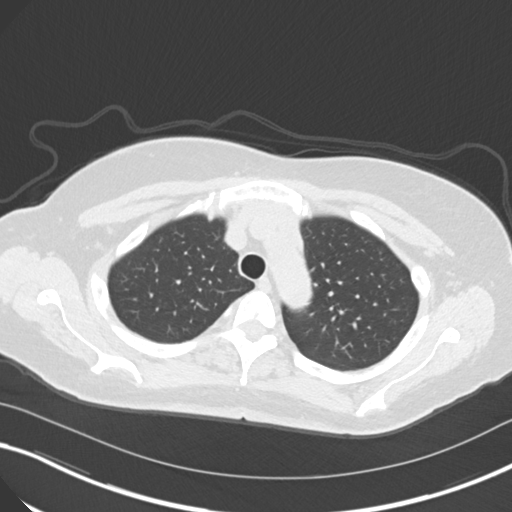
[im 127/148  lung]
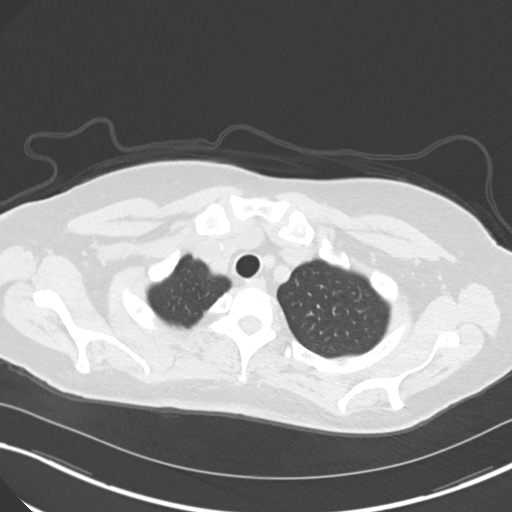
[im 137/148  lung]
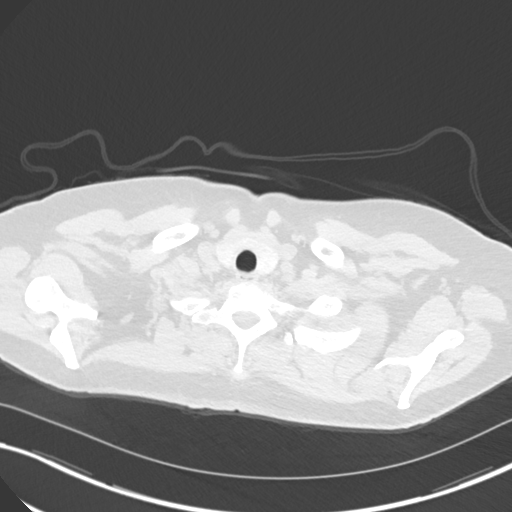

[Series 5: coronal · coronal · 0.59mm/px · 3 of 151 slices shown]
[im 31/151  lung]
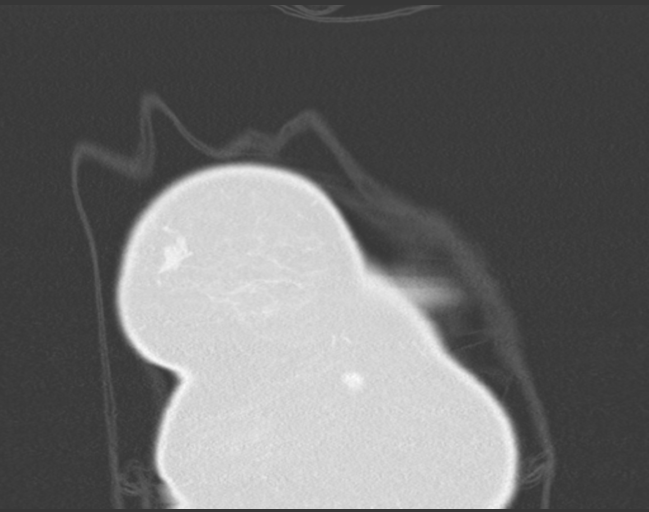
[im 61/151  lung]
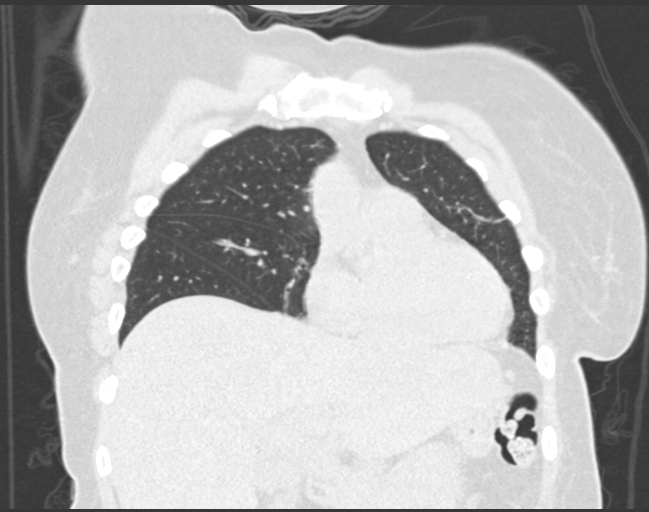
[im 91/151  lung]
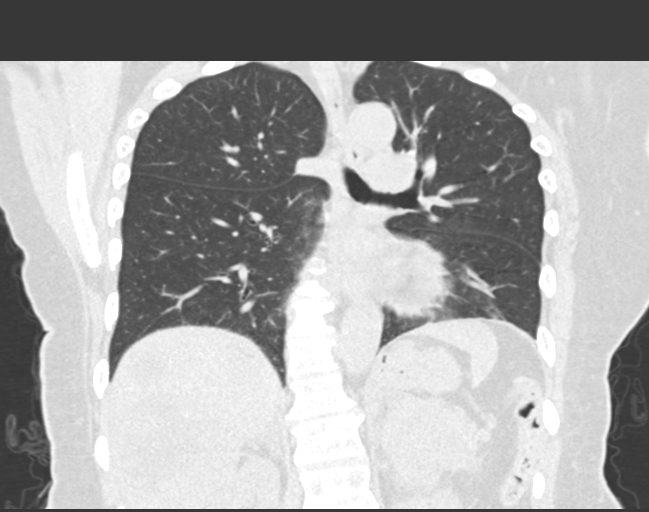

[15 of 36 positions shown; findings below may reference images not displayed]

FINDINGS: Cardiovascular: Atherosclerotic calcification of the aortic arch
noted.

Mediastinum/Nodes: Small hypodense thyroid nodules are present but
do not warrant further workup based on size. No significant thoracic
adenopathy observed.

Lungs/Pleura: Linear scarring medially in the right middle lobe.
Scarring and potentially superimposed atelectasis in the lingula. No
findings of metastatic disease to the lungs.

Upper Abdomen: Numerous hypodense masses in the liver compatible
with known metastatic lesions. Large pancreatic tail mass as shown
on recent dedicated CT abdomen.

Musculoskeletal: Thoracic spondylosis and dextroconvex thoracic
scoliosis with rotary component.
IMPRESSION: 1. No findings of metastatic disease to the chest.
2. Large pancreatic tail mass as shown on recent dedicated CT
abdomen. Numerous hypodense masses in the liver compatible with
known metastatic lesions.
3. Thoracic spondylosis and dextroconvex thoracic scoliosis with
rotary component.
4.  Aortic Atherosclerosis (SGFL9-VG5.5).
